# Patient Record
Sex: Male | Born: 1945
Health system: Southern US, Community
[De-identification: ages and names within clinical notes are randomized; demographics above are authoritative.]

## PROBLEM LIST (undated history)

## (undated) ENCOUNTER — Emergency Department (HOSPITAL_BASED_OUTPATIENT_CLINIC_OR_DEPARTMENT_OTHER): Admission: EM | Payer: Self-pay | Source: Home / Self Care

## (undated) DIAGNOSIS — I340 Nonrheumatic mitral (valve) insufficiency: Secondary | ICD-10-CM

## (undated) DIAGNOSIS — K5792 Diverticulitis of intestine, part unspecified, without perforation or abscess without bleeding: Secondary | ICD-10-CM

## (undated) DIAGNOSIS — M1990 Unspecified osteoarthritis, unspecified site: Secondary | ICD-10-CM

## (undated) DIAGNOSIS — E119 Type 2 diabetes mellitus without complications: Secondary | ICD-10-CM

## (undated) DIAGNOSIS — M797 Fibromyalgia: Secondary | ICD-10-CM

## (undated) DIAGNOSIS — I10 Essential (primary) hypertension: Secondary | ICD-10-CM

## (undated) DIAGNOSIS — J449 Chronic obstructive pulmonary disease, unspecified: Secondary | ICD-10-CM

## (undated) DIAGNOSIS — H9190 Unspecified hearing loss, unspecified ear: Secondary | ICD-10-CM

## (undated) DIAGNOSIS — J189 Pneumonia, unspecified organism: Secondary | ICD-10-CM

## (undated) DIAGNOSIS — M069 Rheumatoid arthritis, unspecified: Secondary | ICD-10-CM

## (undated) HISTORY — DX: Rheumatoid arthritis, unspecified: M06.9

## (undated) HISTORY — DX: Chronic obstructive pulmonary disease, unspecified: J44.9

## (undated) HISTORY — PX: HERNIA REPAIR: SHX51

## (undated) HISTORY — PX: CORONARY ANGIOPLASTY WITH STENT PLACEMENT: SHX49

---

## 1984-05-29 HISTORY — PX: HAND SURGERY: SHX662

## 2011-05-30 DIAGNOSIS — I2102 ST elevation (STEMI) myocardial infarction involving left anterior descending coronary artery: Secondary | ICD-10-CM

## 2011-05-30 HISTORY — PX: CORONARY ANGIOPLASTY WITH STENT PLACEMENT: SHX49

## 2011-05-30 HISTORY — DX: ST elevation (STEMI) myocardial infarction involving left anterior descending coronary artery: I21.02

## 2014-06-15 DIAGNOSIS — K219 Gastro-esophageal reflux disease without esophagitis: Secondary | ICD-10-CM | POA: Diagnosis not present

## 2014-06-15 DIAGNOSIS — I10 Essential (primary) hypertension: Secondary | ICD-10-CM | POA: Diagnosis not present

## 2014-06-15 DIAGNOSIS — I259 Chronic ischemic heart disease, unspecified: Secondary | ICD-10-CM | POA: Diagnosis not present

## 2014-06-15 DIAGNOSIS — E785 Hyperlipidemia, unspecified: Secondary | ICD-10-CM | POA: Diagnosis not present

## 2014-06-15 DIAGNOSIS — I2510 Atherosclerotic heart disease of native coronary artery without angina pectoris: Secondary | ICD-10-CM | POA: Diagnosis not present

## 2014-06-15 DIAGNOSIS — K5720 Diverticulitis of large intestine with perforation and abscess without bleeding: Secondary | ICD-10-CM | POA: Diagnosis not present

## 2014-10-12 DIAGNOSIS — M069 Rheumatoid arthritis, unspecified: Secondary | ICD-10-CM | POA: Diagnosis not present

## 2014-10-12 DIAGNOSIS — K219 Gastro-esophageal reflux disease without esophagitis: Secondary | ICD-10-CM | POA: Diagnosis not present

## 2014-10-12 DIAGNOSIS — K5720 Diverticulitis of large intestine with perforation and abscess without bleeding: Secondary | ICD-10-CM | POA: Diagnosis not present

## 2014-10-12 DIAGNOSIS — F419 Anxiety disorder, unspecified: Secondary | ICD-10-CM | POA: Diagnosis not present

## 2014-10-12 DIAGNOSIS — I10 Essential (primary) hypertension: Secondary | ICD-10-CM | POA: Diagnosis not present

## 2015-02-15 DIAGNOSIS — I259 Chronic ischemic heart disease, unspecified: Secondary | ICD-10-CM | POA: Diagnosis not present

## 2015-02-15 DIAGNOSIS — K219 Gastro-esophageal reflux disease without esophagitis: Secondary | ICD-10-CM | POA: Diagnosis not present

## 2015-02-15 DIAGNOSIS — I10 Essential (primary) hypertension: Secondary | ICD-10-CM | POA: Diagnosis not present

## 2015-02-15 DIAGNOSIS — E785 Hyperlipidemia, unspecified: Secondary | ICD-10-CM | POA: Diagnosis not present

## 2015-07-13 DIAGNOSIS — I2510 Atherosclerotic heart disease of native coronary artery without angina pectoris: Secondary | ICD-10-CM | POA: Diagnosis not present

## 2015-07-13 DIAGNOSIS — I10 Essential (primary) hypertension: Secondary | ICD-10-CM | POA: Diagnosis not present

## 2015-07-13 DIAGNOSIS — K219 Gastro-esophageal reflux disease without esophagitis: Secondary | ICD-10-CM | POA: Diagnosis not present

## 2015-07-13 DIAGNOSIS — F419 Anxiety disorder, unspecified: Secondary | ICD-10-CM | POA: Diagnosis not present

## 2015-07-13 DIAGNOSIS — Z0001 Encounter for general adult medical examination with abnormal findings: Secondary | ICD-10-CM | POA: Diagnosis not present

## 2015-09-29 DIAGNOSIS — I10 Essential (primary) hypertension: Secondary | ICD-10-CM | POA: Diagnosis not present

## 2015-09-29 DIAGNOSIS — R05 Cough: Secondary | ICD-10-CM | POA: Diagnosis not present

## 2015-11-22 DIAGNOSIS — M06879 Other specified rheumatoid arthritis, unspecified ankle and foot: Secondary | ICD-10-CM | POA: Diagnosis not present

## 2015-11-22 DIAGNOSIS — K219 Gastro-esophageal reflux disease without esophagitis: Secondary | ICD-10-CM | POA: Diagnosis not present

## 2015-11-22 DIAGNOSIS — R05 Cough: Secondary | ICD-10-CM | POA: Diagnosis not present

## 2015-11-22 DIAGNOSIS — I2510 Atherosclerotic heart disease of native coronary artery without angina pectoris: Secondary | ICD-10-CM | POA: Diagnosis not present

## 2015-12-07 DIAGNOSIS — M069 Rheumatoid arthritis, unspecified: Secondary | ICD-10-CM | POA: Diagnosis not present

## 2015-12-07 DIAGNOSIS — J449 Chronic obstructive pulmonary disease, unspecified: Secondary | ICD-10-CM | POA: Diagnosis not present

## 2015-12-07 DIAGNOSIS — J42 Unspecified chronic bronchitis: Secondary | ICD-10-CM | POA: Diagnosis not present

## 2015-12-14 DIAGNOSIS — J42 Unspecified chronic bronchitis: Secondary | ICD-10-CM | POA: Diagnosis not present

## 2015-12-14 DIAGNOSIS — R911 Solitary pulmonary nodule: Secondary | ICD-10-CM | POA: Diagnosis not present

## 2015-12-14 DIAGNOSIS — F17201 Nicotine dependence, unspecified, in remission: Secondary | ICD-10-CM | POA: Diagnosis not present

## 2015-12-22 DIAGNOSIS — R911 Solitary pulmonary nodule: Secondary | ICD-10-CM | POA: Diagnosis not present

## 2015-12-22 DIAGNOSIS — R05 Cough: Secondary | ICD-10-CM | POA: Diagnosis not present

## 2015-12-22 DIAGNOSIS — J42 Unspecified chronic bronchitis: Secondary | ICD-10-CM | POA: Diagnosis not present

## 2015-12-27 DIAGNOSIS — R911 Solitary pulmonary nodule: Secondary | ICD-10-CM | POA: Diagnosis not present

## 2015-12-27 DIAGNOSIS — F17201 Nicotine dependence, unspecified, in remission: Secondary | ICD-10-CM | POA: Diagnosis not present

## 2015-12-28 DIAGNOSIS — M069 Rheumatoid arthritis, unspecified: Secondary | ICD-10-CM | POA: Diagnosis not present

## 2015-12-28 DIAGNOSIS — R911 Solitary pulmonary nodule: Secondary | ICD-10-CM | POA: Diagnosis not present

## 2015-12-28 DIAGNOSIS — F17201 Nicotine dependence, unspecified, in remission: Secondary | ICD-10-CM | POA: Diagnosis not present

## 2016-01-20 DIAGNOSIS — R42 Dizziness and giddiness: Secondary | ICD-10-CM | POA: Diagnosis not present

## 2016-01-21 DIAGNOSIS — H8113 Benign paroxysmal vertigo, bilateral: Secondary | ICD-10-CM | POA: Diagnosis not present

## 2016-01-21 DIAGNOSIS — R51 Headache: Secondary | ICD-10-CM | POA: Diagnosis not present

## 2016-01-21 DIAGNOSIS — H8110 Benign paroxysmal vertigo, unspecified ear: Secondary | ICD-10-CM | POA: Diagnosis not present

## 2016-01-21 DIAGNOSIS — I10 Essential (primary) hypertension: Secondary | ICD-10-CM | POA: Diagnosis not present

## 2016-01-21 DIAGNOSIS — R42 Dizziness and giddiness: Secondary | ICD-10-CM | POA: Diagnosis not present

## 2016-05-08 DIAGNOSIS — E119 Type 2 diabetes mellitus without complications: Secondary | ICD-10-CM | POA: Diagnosis not present

## 2016-05-08 DIAGNOSIS — M069 Rheumatoid arthritis, unspecified: Secondary | ICD-10-CM | POA: Diagnosis not present

## 2016-05-08 DIAGNOSIS — I2510 Atherosclerotic heart disease of native coronary artery without angina pectoris: Secondary | ICD-10-CM | POA: Diagnosis not present

## 2016-05-08 DIAGNOSIS — E785 Hyperlipidemia, unspecified: Secondary | ICD-10-CM | POA: Diagnosis not present

## 2016-05-08 DIAGNOSIS — I10 Essential (primary) hypertension: Secondary | ICD-10-CM | POA: Diagnosis not present

## 2016-05-08 DIAGNOSIS — K219 Gastro-esophageal reflux disease without esophagitis: Secondary | ICD-10-CM | POA: Diagnosis not present

## 2016-05-08 DIAGNOSIS — Z1389 Encounter for screening for other disorder: Secondary | ICD-10-CM | POA: Diagnosis not present

## 2016-08-21 DIAGNOSIS — F419 Anxiety disorder, unspecified: Secondary | ICD-10-CM | POA: Diagnosis not present

## 2016-08-21 DIAGNOSIS — E669 Obesity, unspecified: Secondary | ICD-10-CM | POA: Diagnosis not present

## 2016-08-21 DIAGNOSIS — Z9181 History of falling: Secondary | ICD-10-CM | POA: Diagnosis not present

## 2016-08-21 DIAGNOSIS — Z6831 Body mass index (BMI) 31.0-31.9, adult: Secondary | ICD-10-CM | POA: Diagnosis not present

## 2016-08-21 DIAGNOSIS — Z1389 Encounter for screening for other disorder: Secondary | ICD-10-CM | POA: Diagnosis not present

## 2016-08-21 DIAGNOSIS — I10 Essential (primary) hypertension: Secondary | ICD-10-CM | POA: Diagnosis not present

## 2016-08-21 DIAGNOSIS — K219 Gastro-esophageal reflux disease without esophagitis: Secondary | ICD-10-CM | POA: Diagnosis not present

## 2016-09-22 DIAGNOSIS — Z79899 Other long term (current) drug therapy: Secondary | ICD-10-CM | POA: Diagnosis not present

## 2016-09-22 DIAGNOSIS — I2510 Atherosclerotic heart disease of native coronary artery without angina pectoris: Secondary | ICD-10-CM | POA: Diagnosis not present

## 2016-09-22 DIAGNOSIS — R739 Hyperglycemia, unspecified: Secondary | ICD-10-CM | POA: Diagnosis not present

## 2016-09-22 DIAGNOSIS — Z6831 Body mass index (BMI) 31.0-31.9, adult: Secondary | ICD-10-CM | POA: Diagnosis not present

## 2016-09-22 DIAGNOSIS — E785 Hyperlipidemia, unspecified: Secondary | ICD-10-CM | POA: Diagnosis not present

## 2016-09-22 DIAGNOSIS — E876 Hypokalemia: Secondary | ICD-10-CM | POA: Diagnosis not present

## 2016-09-22 DIAGNOSIS — F419 Anxiety disorder, unspecified: Secondary | ICD-10-CM | POA: Diagnosis not present

## 2016-09-22 DIAGNOSIS — I10 Essential (primary) hypertension: Secondary | ICD-10-CM | POA: Diagnosis not present

## 2016-10-25 DIAGNOSIS — I10 Essential (primary) hypertension: Secondary | ICD-10-CM | POA: Diagnosis not present

## 2016-10-25 DIAGNOSIS — F419 Anxiety disorder, unspecified: Secondary | ICD-10-CM | POA: Diagnosis not present

## 2016-10-25 DIAGNOSIS — Z6831 Body mass index (BMI) 31.0-31.9, adult: Secondary | ICD-10-CM | POA: Diagnosis not present

## 2016-10-25 DIAGNOSIS — E1165 Type 2 diabetes mellitus with hyperglycemia: Secondary | ICD-10-CM | POA: Diagnosis not present

## 2016-10-25 DIAGNOSIS — E785 Hyperlipidemia, unspecified: Secondary | ICD-10-CM | POA: Diagnosis not present

## 2016-12-04 DIAGNOSIS — Z6830 Body mass index (BMI) 30.0-30.9, adult: Secondary | ICD-10-CM | POA: Diagnosis not present

## 2016-12-04 DIAGNOSIS — E1165 Type 2 diabetes mellitus with hyperglycemia: Secondary | ICD-10-CM | POA: Diagnosis not present

## 2016-12-04 DIAGNOSIS — M069 Rheumatoid arthritis, unspecified: Secondary | ICD-10-CM | POA: Diagnosis not present

## 2016-12-04 DIAGNOSIS — F419 Anxiety disorder, unspecified: Secondary | ICD-10-CM | POA: Diagnosis not present

## 2017-02-15 DIAGNOSIS — E1165 Type 2 diabetes mellitus with hyperglycemia: Secondary | ICD-10-CM | POA: Diagnosis not present

## 2017-02-20 DIAGNOSIS — E785 Hyperlipidemia, unspecified: Secondary | ICD-10-CM | POA: Diagnosis not present

## 2017-02-20 DIAGNOSIS — I10 Essential (primary) hypertension: Secondary | ICD-10-CM | POA: Diagnosis not present

## 2017-02-20 DIAGNOSIS — E663 Overweight: Secondary | ICD-10-CM | POA: Diagnosis not present

## 2017-02-20 DIAGNOSIS — Z6829 Body mass index (BMI) 29.0-29.9, adult: Secondary | ICD-10-CM | POA: Diagnosis not present

## 2017-02-20 DIAGNOSIS — R0981 Nasal congestion: Secondary | ICD-10-CM | POA: Diagnosis not present

## 2017-02-20 DIAGNOSIS — E1165 Type 2 diabetes mellitus with hyperglycemia: Secondary | ICD-10-CM | POA: Diagnosis not present

## 2017-02-20 DIAGNOSIS — Z79899 Other long term (current) drug therapy: Secondary | ICD-10-CM | POA: Diagnosis not present

## 2017-04-26 DIAGNOSIS — Z6829 Body mass index (BMI) 29.0-29.9, adult: Secondary | ICD-10-CM | POA: Diagnosis not present

## 2017-04-26 DIAGNOSIS — K219 Gastro-esophageal reflux disease without esophagitis: Secondary | ICD-10-CM | POA: Diagnosis not present

## 2017-04-26 DIAGNOSIS — L989 Disorder of the skin and subcutaneous tissue, unspecified: Secondary | ICD-10-CM | POA: Diagnosis not present

## 2017-04-26 DIAGNOSIS — E1165 Type 2 diabetes mellitus with hyperglycemia: Secondary | ICD-10-CM | POA: Diagnosis not present

## 2017-04-26 DIAGNOSIS — I10 Essential (primary) hypertension: Secondary | ICD-10-CM | POA: Diagnosis not present

## 2017-04-26 DIAGNOSIS — F419 Anxiety disorder, unspecified: Secondary | ICD-10-CM | POA: Diagnosis not present

## 2017-06-22 DIAGNOSIS — K219 Gastro-esophageal reflux disease without esophagitis: Secondary | ICD-10-CM | POA: Diagnosis not present

## 2017-06-22 DIAGNOSIS — Z6829 Body mass index (BMI) 29.0-29.9, adult: Secondary | ICD-10-CM | POA: Diagnosis not present

## 2017-06-22 DIAGNOSIS — K5792 Diverticulitis of intestine, part unspecified, without perforation or abscess without bleeding: Secondary | ICD-10-CM | POA: Diagnosis not present

## 2017-06-22 DIAGNOSIS — Z79899 Other long term (current) drug therapy: Secondary | ICD-10-CM | POA: Diagnosis not present

## 2017-06-22 DIAGNOSIS — M069 Rheumatoid arthritis, unspecified: Secondary | ICD-10-CM | POA: Diagnosis not present

## 2017-06-22 DIAGNOSIS — E785 Hyperlipidemia, unspecified: Secondary | ICD-10-CM | POA: Diagnosis not present

## 2017-06-22 DIAGNOSIS — F419 Anxiety disorder, unspecified: Secondary | ICD-10-CM | POA: Diagnosis not present

## 2017-06-22 DIAGNOSIS — E1165 Type 2 diabetes mellitus with hyperglycemia: Secondary | ICD-10-CM | POA: Diagnosis not present

## 2017-07-01 ENCOUNTER — Encounter (HOSPITAL_BASED_OUTPATIENT_CLINIC_OR_DEPARTMENT_OTHER): Payer: Self-pay | Admitting: Emergency Medicine

## 2017-07-01 ENCOUNTER — Emergency Department (HOSPITAL_BASED_OUTPATIENT_CLINIC_OR_DEPARTMENT_OTHER)
Admission: EM | Admit: 2017-07-01 | Discharge: 2017-07-01 | Disposition: A | Discharge status: Home / Self Care | Payer: Medicare Other | Attending: Emergency Medicine | Admitting: Emergency Medicine

## 2017-07-01 ENCOUNTER — Other Ambulatory Visit: Payer: Self-pay

## 2017-07-01 DIAGNOSIS — H66002 Acute suppurative otitis media without spontaneous rupture of ear drum, left ear: Secondary | ICD-10-CM | POA: Insufficient documentation

## 2017-07-01 DIAGNOSIS — Z79899 Other long term (current) drug therapy: Secondary | ICD-10-CM | POA: Diagnosis not present

## 2017-07-01 DIAGNOSIS — R070 Pain in throat: Secondary | ICD-10-CM | POA: Diagnosis not present

## 2017-07-01 DIAGNOSIS — E119 Type 2 diabetes mellitus without complications: Secondary | ICD-10-CM | POA: Diagnosis not present

## 2017-07-01 DIAGNOSIS — I10 Essential (primary) hypertension: Secondary | ICD-10-CM | POA: Diagnosis not present

## 2017-07-01 DIAGNOSIS — Z7984 Long term (current) use of oral hypoglycemic drugs: Secondary | ICD-10-CM | POA: Diagnosis not present

## 2017-07-01 DIAGNOSIS — Z7982 Long term (current) use of aspirin: Secondary | ICD-10-CM | POA: Insufficient documentation

## 2017-07-01 DIAGNOSIS — H9202 Otalgia, left ear: Secondary | ICD-10-CM | POA: Diagnosis not present

## 2017-07-01 DIAGNOSIS — R05 Cough: Secondary | ICD-10-CM | POA: Diagnosis present

## 2017-07-01 DIAGNOSIS — I491 Atrial premature depolarization: Secondary | ICD-10-CM | POA: Diagnosis not present

## 2017-07-01 DIAGNOSIS — Z7902 Long term (current) use of antithrombotics/antiplatelets: Secondary | ICD-10-CM | POA: Insufficient documentation

## 2017-07-01 DIAGNOSIS — R0981 Nasal congestion: Secondary | ICD-10-CM | POA: Diagnosis not present

## 2017-07-01 DIAGNOSIS — R590 Localized enlarged lymph nodes: Secondary | ICD-10-CM | POA: Diagnosis not present

## 2017-07-01 DIAGNOSIS — J069 Acute upper respiratory infection, unspecified: Secondary | ICD-10-CM | POA: Diagnosis not present

## 2017-07-01 DIAGNOSIS — M542 Cervicalgia: Secondary | ICD-10-CM | POA: Insufficient documentation

## 2017-07-01 HISTORY — DX: Unspecified osteoarthritis, unspecified site: M19.90

## 2017-07-01 HISTORY — DX: Diverticulitis of intestine, part unspecified, without perforation or abscess without bleeding: K57.92

## 2017-07-01 HISTORY — DX: Essential (primary) hypertension: I10

## 2017-07-01 HISTORY — DX: Type 2 diabetes mellitus without complications: E11.9

## 2017-07-01 MED ORDER — AMOXICILLIN-POT CLAVULANATE 875-125 MG PO TABS: 1.0000 | ORAL_TABLET | Freq: Two times a day (BID) | ORAL | 0 refills | Status: AC

## 2017-07-01 MED ORDER — GI COCKTAIL ~~LOC~~
30.0000 mL | Freq: Once | ORAL | Status: AC
  Administered 2017-07-01: 30 mL via ORAL
  Filled 2017-07-01: qty 30

## 2017-07-01 NOTE — ED Provider Notes (Signed)
[facility] EMERGENCY DEPARTMENT Provider Note   CSN: [id number] Arrival date & time: 07/01/17  1028     History   Chief Complaint Chief Complaint  Patient presents with  . Neck Pain    HPI Cristen Murcia is a 72 y.o. male.  HPI   72 year old male with a history of diabetes, hypertension presents with concern for cough, congestion, left ear pain, and lymph node swelling.  Reports the cough and congestion began around 5 days ago, and was associate with sore throat.  Yesterday, he began to have left ear pain which is severe, with noted submandibular swelling.  Denies any known fevers.  Does report he is having some left-sided sinus fullness and pressure.  Patient reports he does have a history of stent placement.  He began to have heartburn in the emergency department which is typical of his heartburn, not typical of the pain he had with coronary artery disease, is requesting any comes from his car.  Past Medical History:  Diagnosis Date  . Arthritis   . Diabetes mellitus without complication (HCC)   . Diverticulitis   . Hypertension     There are no active problems to display for this patient.   History reviewed. No pertinent surgical history.     Home Medications    Prior to Admission medications   Medication Sig Start Date End Date Taking? Authorizing Provider  aspirin EC 81 MG tablet Take 81 mg by mouth daily.   Yes [provider]  diazepam (VALIUM) 5 MG tablet Take 5 mg by mouth every 6 (six) hours as needed for anxiety.   Yes [provider]  Dulaglutide (TRULICITY) 1.5 MG/0.5ML SOPN Inject into the skin.   Yes [provider]  irbesartan-hydrochlorothiazide (AVALIDE) 150-12.5 MG tablet Take 1 tablet by mouth daily.   Yes [provider]  potassium chloride (K-DUR,KLOR-CON) 10 MEQ tablet Take 10 mEq by mouth 2 (two) times daily.   Yes [provider]  pravastatin (PRAVACHOL) 20 MG tablet Take 20 mg by mouth  daily.   Yes [provider]  amoxicillin-clavulanate (AUGMENTIN) 875-125 MG tablet Take 1 tablet by mouth every 12 (twelve) hours for 10 days. 07/01/17 07/11/17  Alvira Monday, MD    Family History History reviewed. No pertinent family history.  Social History Social History   Tobacco Use  . Smoking status: Never Smoker  . Smokeless tobacco: Never Used  Substance Use Topics  . Alcohol use: No    Frequency: Never  . Drug use: No     Allergies   Ivp dye [iodinated diagnostic agents]   Review of Systems Review of Systems  Constitutional: Negative for fever.  HENT: Positive for congestion and sore throat.   Eyes: Negative for visual disturbance.  Respiratory: Positive for cough. Negative for shortness of breath.   Cardiovascular: Negative for chest pain.  Gastrointestinal: Negative for abdominal pain, nausea and vomiting.  Genitourinary: Negative for difficulty urinating.  Musculoskeletal: Negative for back pain and neck stiffness.  Skin: Negative for rash.  Neurological: Negative for syncope and headaches.     Physical Exam Updated Vital Signs BP (!) 155/93   Pulse 67   Temp 98.5 F (36.9 C) (Oral)   Resp 15   Ht 5\' 10"  (1.778 m)   Wt 83.9 kg (185 lb)   SpO2 99%   BMI 26.54 kg/m   Physical Exam  Constitutional: He is oriented to person, place, and time. He appears well-developed and well-nourished. No distress.  HENT:  Head: Normocephalic and atraumatic.  Submandibular lymphadenopathy, preauricular lymph nodes, No surrounding erythema  Eyes: Conjunctivae and EOM are normal. Pupils are equal, round, and reactive to light.  Left TM bulging, purulent, fluid, right TM with effusion   Neck: Normal range of motion.  Cardiovascular: Normal rate, regular rhythm, normal heart sounds and intact distal pulses. Exam reveals no gallop and no friction rub.  No murmur heard. Pulmonary/Chest: Effort normal and breath sounds normal. No respiratory distress. He  has no wheezes. He has no rales.  Abdominal: Soft. He exhibits no distension. There is no tenderness. There is no guarding.  Musculoskeletal: He exhibits no edema.  Lymphadenopathy:    He has cervical adenopathy (bilateral).  Neurological: He is alert and oriented to person, place, and time.  Skin: Skin is warm and dry. He is not diaphoretic.  Nursing note and vitals reviewed.    ED Treatments / Results  Labs (all labs ordered are listed, but only abnormal results are displayed) Labs Reviewed - No data to display  EKG  EKG Interpretation  Date/Time:  Sunday July 01 2017 13:23:41 EST Ventricular Rate:  65 PR Interval:    QRS Duration: 122 QT Interval:  352 QTC Calculation: 366 R Axis:   -56 Text Interpretation:  Sinus rhythm Atrial premature complex IVCD, consider atypical RBBB LVH with secondary repolarization abnormality Anterior infarct, old Baseline wander in lead(s) II V3 No previous ECGs available Confirmed by Erynne Kealey ([id number]) on 07/01/2017 1:44:00 PM       Radiology No results found.  Procedures Procedures (including critical care time)  Medications Ordered in ED Medications  gi cocktail (Maalox,Lidocaine,Donnatal) (30 mLs Oral Given 07/01/17 1340)     Initial Impression / Assessment and Plan / ED Course  I have reviewed the triage vital signs and the nursing notes.  Pertinent labs & imaging results that were available during my care of the patient were reviewed by me and considered in my medical decision making (see chart for details).     72 year old male with a history of diabetes, hypertension presents with concern for cough, congestion, left ear pain, and lymph node swelling.  History is consistent with a likely viral URI, with viral pharyngitis, and reactive lymphadenopathy secondary to infection.  Patient does have signs of a left-sided otitis media and given duration of symptoms prior to development of ear pain and symptoms, suspect likely  bacterial otitis media.  He did have a small amount of wax which was irrigated from by nursing in the emergency department.  He has some pre-auricular swelling and submandibular swelling which I feels most likely lymphadenopathy secondary to infection.  He does not have significant parotid swelling, erythema over the parotid and have low suspicion for a supperative parotitis or abscess.  Given prescription for Augmentin for likely bacterial otitis media.  Recommend follow-up with his primary care physician in 2 weeks for reevaluation.  Discussed that his lymphadenopathy is likely reactive secondary to viral infection, however if he does not have symptoms he may require further evaluation.   Final Clinical Impressions(s) / ED Diagnoses   Final diagnoses:  Upper respiratory tract infection, unspecified type  Cervical lymphadenopathy  Acute suppurative otitis media of left ear without spontaneous rupture of tympanic membrane, recurrence not specified    ED Discharge Orders        Ordered    amoxicillin-clavulanate (AUGMENTIN) 875-125 MG tablet  Every 12 hours     02 /03/19 1342       04-01-2005, MD 07/01/17  1840  

## 2017-07-01 NOTE — ED Notes (Signed)
L ear irrigated with noted wax evacuated from ear. Pt reports improvement in ear discomfort.

## 2017-07-01 NOTE — ED Triage Notes (Signed)
Patient states that he started having swelling to his bilateral neck and Lymph nodes yesterday

## 2017-08-20 DIAGNOSIS — E663 Overweight: Secondary | ICD-10-CM | POA: Diagnosis not present

## 2017-08-20 DIAGNOSIS — F419 Anxiety disorder, unspecified: Secondary | ICD-10-CM | POA: Diagnosis not present

## 2017-08-20 DIAGNOSIS — Z6829 Body mass index (BMI) 29.0-29.9, adult: Secondary | ICD-10-CM | POA: Diagnosis not present

## 2017-08-20 DIAGNOSIS — M069 Rheumatoid arthritis, unspecified: Secondary | ICD-10-CM | POA: Diagnosis not present

## 2017-08-20 DIAGNOSIS — J449 Chronic obstructive pulmonary disease, unspecified: Secondary | ICD-10-CM | POA: Diagnosis not present

## 2017-09-12 DIAGNOSIS — E785 Hyperlipidemia, unspecified: Secondary | ICD-10-CM | POA: Diagnosis not present

## 2017-09-12 DIAGNOSIS — Z139 Encounter for screening, unspecified: Secondary | ICD-10-CM | POA: Diagnosis not present

## 2017-09-12 DIAGNOSIS — Z125 Encounter for screening for malignant neoplasm of prostate: Secondary | ICD-10-CM | POA: Diagnosis not present

## 2017-09-12 DIAGNOSIS — Z9181 History of falling: Secondary | ICD-10-CM | POA: Diagnosis not present

## 2017-09-12 DIAGNOSIS — Z0000 Encounter for general adult medical examination without abnormal findings: Secondary | ICD-10-CM | POA: Diagnosis not present

## 2017-10-23 DIAGNOSIS — F419 Anxiety disorder, unspecified: Secondary | ICD-10-CM | POA: Diagnosis not present

## 2017-10-23 DIAGNOSIS — E785 Hyperlipidemia, unspecified: Secondary | ICD-10-CM | POA: Diagnosis not present

## 2017-10-23 DIAGNOSIS — Z79899 Other long term (current) drug therapy: Secondary | ICD-10-CM | POA: Diagnosis not present

## 2017-10-23 DIAGNOSIS — M542 Cervicalgia: Secondary | ICD-10-CM | POA: Diagnosis not present

## 2017-10-23 DIAGNOSIS — E1165 Type 2 diabetes mellitus with hyperglycemia: Secondary | ICD-10-CM | POA: Diagnosis not present

## 2017-12-25 DIAGNOSIS — M069 Rheumatoid arthritis, unspecified: Secondary | ICD-10-CM | POA: Diagnosis not present

## 2017-12-25 DIAGNOSIS — E1165 Type 2 diabetes mellitus with hyperglycemia: Secondary | ICD-10-CM | POA: Diagnosis not present

## 2017-12-25 DIAGNOSIS — F419 Anxiety disorder, unspecified: Secondary | ICD-10-CM | POA: Diagnosis not present

## 2017-12-25 DIAGNOSIS — G4700 Insomnia, unspecified: Secondary | ICD-10-CM | POA: Diagnosis not present

## 2018-01-03 NOTE — Progress Notes (Signed)
Office Visit Note  Patient: Patrick Klein             Date of Birth: 1945/11/09           MRN: [id number]             PCP: [facility] Referring: Janine Limbo, PA-C Visit Date: 01/17/2018 Occupation: Cleaning offices  Subjective: Pain and swelling in multiple joints.   History of Present Illness: Patrick Klein is a 72 y.o. male seen in consultation per request of his PCP.  He is accompanied by his wife today.  Who helped giving his history.  The patient states that age 55 he fell and after that he started having right knee joint swelling.  After that the swelling is spread all over his joints.  He was seen by a physician and then a rheumatologist.  The rheumatologist advised DMARDs but he declined.  He decided to take all medications over-the-counter and home remedies.  He has been taking prednisone on PRN basis since then.  He states he takes prednisone 5 mg for 3 days and then usually stops it.  He has not seen a rheumatologist since his initial visit.  Recently has been experiencing increased pain and discomfort.  He describes pain and swelling in his bilateral knee joints and bilateral hands.  He continues to have neck and lower back pain.  He states he was diagnosed with disc disease of lumbar spine in the past.  Activities of Daily Living:  Patient reports morning stiffness for 1 hour.   Patient Reports nocturnal pain.  Difficulty dressing/grooming: Denies Difficulty climbing stairs: Reports Difficulty getting out of chair: Reports Difficulty using hands for taps, buttons, cutlery, and/or writing: Denies  Review of Systems  Constitutional: Negative for fatigue and night sweats.  HENT: Negative for mouth sores, mouth dryness and nose dryness.   Eyes: Negative for redness and dryness.  Respiratory: Negative for shortness of breath and difficulty breathing.   Cardiovascular: Negative for chest pain, palpitations, hypertension, irregular heartbeat and swelling in  legs/feet.  Gastrointestinal: Positive for constipation and diarrhea.       History of diverticulitis  Endocrine: Negative for increased urination.  Musculoskeletal: Positive for arthralgias, joint pain, joint swelling and morning stiffness. Negative for myalgias, muscle weakness, muscle tenderness and myalgias.  Skin: Negative for color change, rash, hair loss, nodules/bumps, skin tightness, ulcers and sensitivity to sunlight.  Allergic/Immunologic: Negative for susceptible to infections.  Neurological: Negative for dizziness, fainting, memory loss, night sweats and weakness ( ).  Hematological: Negative for swollen glands.  Psychiatric/Behavioral: Positive for depressed mood and sleep disturbance. The patient is nervous/anxious.     PMFS History:  There are no active problems to display for this patient.   Past Medical History:  Diagnosis Date  . Arthritis   . Diabetes mellitus without complication ([facility])   . Diverticulitis   . Hypertension     Family History  Problem Relation Age of Onset  . Stroke Mother   . Mitral valve prolapse Mother   . Heart attack Father   . Alcoholism Father   . Alcoholism Brother   . Congestive Heart Failure Brother   . Alcoholism Brother   . Congestive Heart Failure Brother   . Anxiety disorder Daughter   . Healthy Daughter    Past Surgical History:  Procedure Laterality Date  . CORONARY ANGIOPLASTY WITH STENT PLACEMENT    . HAND SURGERY Right 1986   reconstructive surgery on fingers   . HERNIA REPAIR  x2   Social History   Social History Narrative  . Not on file    Objective: Vital Signs: BP (!) 153/81 (BP Location: Right Arm, Patient Position: Sitting, Cuff Size: Normal)   Pulse 65   Resp 15   Ht 5' 6.14" (1.68 m)   Wt 196 lb (88.9 kg)   BMI 31.50 kg/m    Physical Exam  Constitutional: He is oriented to person, place, and time. He appears well-developed and well-nourished.  HENT:  Head: Normocephalic and atraumatic.    Eyes: Pupils are equal, round, and reactive to light. Conjunctivae and EOM are normal.  Neck: Normal range of motion. Neck supple.  Cardiovascular: Normal rate, regular rhythm and normal heart sounds.  Pulmonary/Chest: Effort normal and breath sounds normal.  Abdominal: Soft. Bowel sounds are normal.  Neurological: He is alert and oriented to person, place, and time.  Skin: Skin is warm and dry. Capillary refill takes less than 2 seconds.  Psychiatric: He has a normal mood and affect. His behavior is normal.  Nursing note and vitals reviewed.    Musculoskeletal Exam: C-spine some stiffness with range of motion.  Lumbar spine limited range of motion.  Shoulder joints and elbow joints were in good range of motion.  He has good range of motion of bilateral wrist joints.  Thickening of PIP and DIP joints was noted.  No synovitis was noted.  Hip joints were in good range of motion without discomfort.  He has warmth and swelling in his right knee joint.  He has limited extension of his right knee joint.  Left knee joint was in good range of motion.  Ankle joints MTPs PIPs were in good range of motion with no synovitis.  CDAI Exam: CDAI Score: 2  Patient Global Assessment: 5 (mm); Provider Global Assessment: 5 (mm) Swollen: 1 ; Tender: 0  Joint Exam      Right  Left  Knee  Swollen         Investigation: No additional findings.  Imaging: Xr Foot 2 Views Left  Result Date: 01/17/2018 First MTP, all PIPs and all DIP narrowing was noted.  Spurring was noted at the first MTP joint.  No erosive changes were noted.  No intertarsal or tibiotalar joint space narrowing was noted.  A small calcaneal spur was noted. Impression: These findings are consistent with osteoarthritis of the foot.  Xr Foot 2 Views Right  Result Date: 01/17/2018 First MTP, all PIPs and all DIP narrowing was noted.  Spurring was noted at the first MTP joint.  No erosive changes were noted.  No intertarsal or tibiotalar joint  space narrowing was noted.  A small calcaneal spur was noted. Impression: These findings are consistent with osteoarthritis of the foot.  Xr Hand 2 View Left  Result Date: 01/17/2018 PIP and DIP narrowing was noted.  No MCP changes were noted.  CMC narrowing was noted.  No intercarpal radiocarpal joint space narrowing was noted.  No erosive changes were noted. Impression: These findings are consistent with osteoarthritis of the hand.  Xr Hand 2 View Right  Result Date: 01/17/2018 No MCP narrowing was noted.  PIP and DIP narrowing was noted.  A pin was present in the right fourth middle phalanx.  No intercarpal radiocarpal joint space narrowing was noted.  No erosive changes were noted. Impression: These findings are consistent with osteoarthritis of the hand.  Xr Knee 3 View Left  Result Date: 01/17/2018 Severe medial compartment narrowing was noted.  No chondrocalcinosis was  noted.  Severe patellofemoral narrowing was noted. Impression : These findings are consistent with severe osteoarthritis and severe chondromalacia patella.  Xr Knee 3 View Right  Result Date: 01/17/2018 Severe medial compartment narrowing was noted.  No chondrocalcinosis was noted.  Severe patellofemoral narrowing was noted. Impression : These findings are consistent with severe osteoarthritis and severe chondromalacia patella.   Recent Labs: No results found for: WBC, HGB, PLT, NA, K, CL, CO2, GLUCOSE, BUN, CREATININE, BILITOT, ALKPHOS, AST, ALT, PROT, ALBUMIN, CALCIUM, GFRAA, QFTBGOLD, QFTBGOLDPLUS  Speciality Comments: No specialty comments available.  Procedures:  No procedures performed Allergies: Ivp dye [iodinated diagnostic agents]   Assessment / Plan:     Visit Diagnoses: History of inflammatory arthritis- last seen by rheumatologist 15 yr ago.  Patient reports that he was diagnosed with rheumatoid arthritis by rheumatologist several years ago.  He is never taken any DMARDs.  He takes prednisone 5 mg for 3  days every few months.  He had no synovitis on examination today except for swelling in his right knee joint.  I will obtain following labs to evaluate further.  Long-term use of systemic steroids-patient takes prednisone intermittently for joint pain and swelling.  Pain in both hands -he complains of intermittent swelling in his hands.  He had only DIP thickening on examination.  Plan: XR Hand 2 View Right, XR Hand 2 View Left.  X-ray of bilateral hands were consistent with osteoarthritis.  Chronic pain of both knees -he had warmth and swelling in his right knee joint.  Plan: XR KNEE 3 VIEW RIGHT, XR KNEE 3 VIEW LEFT.  Patient has severe osteoarthritis in bilateral knee joints and severe chondromalacia patella.  Pain in both feet -he complains of feet discomfort.  No swelling or synovitis was noted.  Plan: XR Foot 2 Views Right, XR Foot 2 Views Left.  The x-ray of bilateral feet were consistent with osteoarthritis.  DDD (degenerative disc disease), lumbar-patient gives history of degenerative disease of lumbar spine.  Other medical problems are listed as follows:  History of diverticulitis  History of coronary artery disease  Essential hypertension  Dyslipidemia  History of diabetes mellitus, type II  History of gastroesophageal reflux (GERD)  History of vertigo  History of right bundle branch block (RBBB)  History of myocardial infarction  History of COPD   Orders: Orders Placed This Encounter  Procedures  . XR Hand 2 View Right  . XR Hand 2 View Left  . XR Foot 2 Views Right  . XR Foot 2 Views Left  . XR KNEE 3 VIEW RIGHT  . XR KNEE 3 VIEW LEFT  . CBC with Differential/Platelet  . COMPLETE METABOLIC PANEL WITH GFR  . Urinalysis, Routine w reflex microscopic  . Sedimentation rate  . Uric acid  . CK  . Rheumatoid factor  . Angiotensin converting enzyme  . HLA-B27 antigen  . Cyclic citrul peptide antibody, IgG  . ANA  . Glucose 6 phosphate dehydrogenase   No  orders of the defined types were placed in this encounter.   Face-to-face time spent with patient was 50 minutes. Greater than 50% of time was spent in counseling and coordination of care.  Follow-Up Instructions: Return for Polyarthralgia.   Bo Merino, MD  Note - This record has been created using Editor, commissioning.  Chart creation errors have been sought, but may not always  have been located. Such creation errors do not reflect on  the standard of medical care.

## 2018-01-17 ENCOUNTER — Ambulatory Visit (INDEPENDENT_AMBULATORY_CARE_PROVIDER_SITE_OTHER): Payer: Self-pay

## 2018-01-17 ENCOUNTER — Ambulatory Visit (INDEPENDENT_AMBULATORY_CARE_PROVIDER_SITE_OTHER): Payer: Medicare Other

## 2018-01-17 ENCOUNTER — Ambulatory Visit (INDEPENDENT_AMBULATORY_CARE_PROVIDER_SITE_OTHER): Payer: Medicare Other | Admitting: Rheumatology

## 2018-01-17 ENCOUNTER — Encounter: Payer: Self-pay | Admitting: Rheumatology

## 2018-01-17 VITALS — BP 153/81 | HR 65 | Resp 15 | Ht 66.14 in | Wt 196.0 lb

## 2018-01-17 DIAGNOSIS — Z8709 Personal history of other diseases of the respiratory system: Secondary | ICD-10-CM | POA: Diagnosis not present

## 2018-01-17 DIAGNOSIS — Z8719 Personal history of other diseases of the digestive system: Secondary | ICD-10-CM | POA: Diagnosis not present

## 2018-01-17 DIAGNOSIS — E785 Hyperlipidemia, unspecified: Secondary | ICD-10-CM | POA: Diagnosis not present

## 2018-01-17 DIAGNOSIS — M79671 Pain in right foot: Secondary | ICD-10-CM

## 2018-01-17 DIAGNOSIS — I10 Essential (primary) hypertension: Secondary | ICD-10-CM | POA: Diagnosis not present

## 2018-01-17 DIAGNOSIS — I252 Old myocardial infarction: Secondary | ICD-10-CM | POA: Diagnosis not present

## 2018-01-17 DIAGNOSIS — M79642 Pain in left hand: Secondary | ICD-10-CM

## 2018-01-17 DIAGNOSIS — R5383 Other fatigue: Secondary | ICD-10-CM | POA: Diagnosis not present

## 2018-01-17 DIAGNOSIS — Z87898 Personal history of other specified conditions: Secondary | ICD-10-CM

## 2018-01-17 DIAGNOSIS — M79672 Pain in left foot: Secondary | ICD-10-CM | POA: Diagnosis not present

## 2018-01-17 DIAGNOSIS — M25561 Pain in right knee: Secondary | ICD-10-CM

## 2018-01-17 DIAGNOSIS — M25562 Pain in left knee: Secondary | ICD-10-CM | POA: Diagnosis not present

## 2018-01-17 DIAGNOSIS — M79641 Pain in right hand: Secondary | ICD-10-CM | POA: Diagnosis not present

## 2018-01-17 DIAGNOSIS — Z8639 Personal history of other endocrine, nutritional and metabolic disease: Secondary | ICD-10-CM | POA: Diagnosis not present

## 2018-01-17 DIAGNOSIS — M5136 Other intervertebral disc degeneration, lumbar region: Secondary | ICD-10-CM | POA: Diagnosis not present

## 2018-01-17 DIAGNOSIS — Z8679 Personal history of other diseases of the circulatory system: Secondary | ICD-10-CM

## 2018-01-17 DIAGNOSIS — G8929 Other chronic pain: Secondary | ICD-10-CM

## 2018-01-21 LAB — CBC WITH DIFFERENTIAL/PLATELET
Basophils Absolute: 52 cells/uL (ref 0–200)
Basophils Relative: 0.9 %
EOS PCT: 1 %
Eosinophils Absolute: 58 cells/uL (ref 15–500)
HEMATOCRIT: 42.5 % (ref 38.5–50.0)
Hemoglobin: 14.5 g/dL (ref 13.2–17.1)
LYMPHS ABS: 1015 {cells}/uL (ref 850–3900)
MCH: 28.1 pg (ref 27.0–33.0)
MCHC: 34.1 g/dL (ref 32.0–36.0)
MCV: 82.4 fL (ref 80.0–100.0)
MONOS PCT: 9.1 %
MPV: 9.4 fL (ref 7.5–12.5)
NEUTROS PCT: 71.5 %
Neutro Abs: 4147 cells/uL (ref 1500–7800)
PLATELETS: 163 10*3/uL (ref 140–400)
RBC: 5.16 10*6/uL (ref 4.20–5.80)
RDW: 14.6 % (ref 11.0–15.0)
Total Lymphocyte: 17.5 %
WBC mixed population: 528 cells/uL (ref 200–950)
WBC: 5.8 10*3/uL (ref 3.8–10.8)

## 2018-01-21 LAB — COMPLETE METABOLIC PANEL WITH GFR
AG RATIO: 1.6 (calc) (ref 1.0–2.5)
ALKALINE PHOSPHATASE (APISO): 75 U/L (ref 40–115)
ALT: 17 U/L (ref 9–46)
AST: 16 U/L (ref 10–35)
Albumin: 4.2 g/dL (ref 3.6–5.1)
BILIRUBIN TOTAL: 0.7 mg/dL (ref 0.2–1.2)
BUN: 17 mg/dL (ref 7–25)
CHLORIDE: 105 mmol/L (ref 98–110)
CO2: 28 mmol/L (ref 20–32)
Calcium: 9.9 mg/dL (ref 8.6–10.3)
Creat: 0.85 mg/dL (ref 0.70–1.18)
GFR, Est African American: 100 mL/min/{1.73_m2} (ref >=60)
GFR, Est Non African American: 86 mL/min/{1.73_m2} (ref >=60)
GLOBULIN: 2.6 g/dL (ref 1.9–3.7)
Glucose, Bld: 108 mg/dL — ABNORMAL HIGH (ref 65–99)
POTASSIUM: 3.8 mmol/L (ref 3.5–5.3)
SODIUM: 141 mmol/L (ref 135–146)
Total Protein: 6.8 g/dL (ref 6.1–8.1)

## 2018-01-21 LAB — URINALYSIS, ROUTINE W REFLEX MICROSCOPIC
BILIRUBIN URINE: NEGATIVE
Glucose, UA: NEGATIVE
HGB URINE DIPSTICK: NEGATIVE
KETONES UR: NEGATIVE
Leukocytes, UA: NEGATIVE
NITRITE: NEGATIVE
Protein, ur: NEGATIVE
Specific Gravity, Urine: 1.013 (ref 1.001–1.03)
pH: 6.5 (ref 5.0–8.0)

## 2018-01-21 LAB — SEDIMENTATION RATE: SED RATE: 9 mm/h (ref 0–20)

## 2018-01-21 LAB — CK: CK TOTAL: 58 U/L (ref 44–196)

## 2018-01-21 LAB — ANGIOTENSIN CONVERTING ENZYME: Angiotensin-Converting Enzyme: 26 U/L (ref 9–67)

## 2018-01-21 LAB — RHEUMATOID FACTOR: Rhuematoid fact SerPl-aCnc: <14 IU/mL (ref <14)

## 2018-01-21 LAB — URIC ACID: URIC ACID, SERUM: 4.5 mg/dL (ref 4.0–8.0)

## 2018-01-21 LAB — CYCLIC CITRUL PEPTIDE ANTIBODY, IGG: Cyclic Citrullin Peptide Ab: <16 UNITS

## 2018-01-21 LAB — ANA: Anti Nuclear Antibody(ANA): NEGATIVE

## 2018-01-21 LAB — GLUCOSE 6 PHOSPHATE DEHYDROGENASE: G-6PDH: 19 U/g Hgb (ref 7.0–20.5)

## 2018-01-21 LAB — HLA-B27 ANTIGEN: HLA-B27 Antigen: NEGATIVE

## 2018-02-04 DIAGNOSIS — M5136 Other intervertebral disc degeneration, lumbar region: Secondary | ICD-10-CM | POA: Insufficient documentation

## 2018-02-04 DIAGNOSIS — Z8679 Personal history of other diseases of the circulatory system: Secondary | ICD-10-CM | POA: Insufficient documentation

## 2018-02-04 DIAGNOSIS — Z7952 Long term (current) use of systemic steroids: Secondary | ICD-10-CM | POA: Insufficient documentation

## 2018-02-04 DIAGNOSIS — Z87898 Personal history of other specified conditions: Secondary | ICD-10-CM | POA: Insufficient documentation

## 2018-02-04 DIAGNOSIS — M19072 Primary osteoarthritis, left ankle and foot: Secondary | ICD-10-CM

## 2018-02-04 DIAGNOSIS — Z8719 Personal history of other diseases of the digestive system: Secondary | ICD-10-CM | POA: Insufficient documentation

## 2018-02-04 DIAGNOSIS — M19071 Primary osteoarthritis, right ankle and foot: Secondary | ICD-10-CM | POA: Insufficient documentation

## 2018-02-04 DIAGNOSIS — Z8709 Personal history of other diseases of the respiratory system: Secondary | ICD-10-CM | POA: Insufficient documentation

## 2018-02-04 DIAGNOSIS — M19042 Primary osteoarthritis, left hand: Principal | ICD-10-CM

## 2018-02-04 DIAGNOSIS — Z8639 Personal history of other endocrine, nutritional and metabolic disease: Secondary | ICD-10-CM | POA: Insufficient documentation

## 2018-02-04 DIAGNOSIS — M170 Bilateral primary osteoarthritis of knee: Secondary | ICD-10-CM | POA: Insufficient documentation

## 2018-02-04 DIAGNOSIS — I10 Essential (primary) hypertension: Secondary | ICD-10-CM | POA: Insufficient documentation

## 2018-02-04 DIAGNOSIS — E785 Hyperlipidemia, unspecified: Secondary | ICD-10-CM | POA: Insufficient documentation

## 2018-02-04 DIAGNOSIS — M19041 Primary osteoarthritis, right hand: Secondary | ICD-10-CM | POA: Insufficient documentation

## 2018-02-04 NOTE — Progress Notes (Signed)
Office Visit Note  Patient: Patrick Klein             Date of Birth: 1945-06-08           MRN: [id number]             PCP: [facility] Referring: [facility]* Visit Date: 02/18/2018 Occupation: @GUAROCC @  Subjective:  Follow-up (no issues, doing good)   History of Present Illness: Patrick Klein is a 72 y.o. male with history of osteoarthritis and inflammatory arthritis.  He states he continues to have some pain and discomfort in the joints.  He describes pain in his bilateral knee joints.  He has occasional discomfort in his hands especially in the [facility] joints.  Denies any discomfort in his feet currently.  His lower back pain is better after he went to a chiropractor.  He has been exercising on a regular basis.  Activities of Daily Living:  Patient reports morning stiffness for 0 minute.   Patient Reports nocturnal pain.  Difficulty dressing/grooming: Denies Difficulty climbing stairs: Reports Difficulty getting out of chair: Reports Difficulty using hands for taps, buttons, cutlery, and/or writing: Reports  Review of Systems  Constitutional: Negative for fatigue.  HENT: Negative for ear ringing, mouth dryness and nose dryness.   Eyes: Negative for redness and dryness.  Respiratory: Negative for apnea and difficulty breathing.   Cardiovascular: Positive for hypertension. Negative for palpitations and swelling in legs/feet.  Gastrointestinal: Negative for blood in stool, constipation and diarrhea.  Endocrine: Negative for increased urination.  Musculoskeletal: Positive for arthralgias, joint pain, myalgias and myalgias. Negative for joint swelling, muscle weakness and morning stiffness.  Skin: Negative for rash, hair loss and sensitivity to sunlight.  Allergic/Immunologic: Negative for susceptible to infections.  Neurological: Positive for weakness. Negative for dizziness, numbness, headaches and memory loss.  Hematological: Negative for swollen  glands.  Psychiatric/Behavioral: Positive for sleep disturbance. Negative for depressed mood. The patient is nervous/anxious.     PMFS History:  Patient Active Problem List   Diagnosis Date Noted  . Primary osteoarthritis of both hands 02/04/2018  . Primary osteoarthritis of both knees 02/04/2018  . Primary osteoarthritis of both feet 02/04/2018  . Long term (current) use of systemic steroids 02/04/2018  . DDD (degenerative disc disease), lumbar 02/04/2018  . Dyslipidemia 02/04/2018  . History of diabetes mellitus 02/04/2018  . Essential hypertension 02/04/2018  . History of right bundle branch block 02/04/2018  . History of diverticulitis 02/04/2018  . History of gastroesophageal reflux (GERD) 02/04/2018  . History of COPD 02/04/2018  . History of vertigo 02/04/2018    Past Medical History:  Diagnosis Date  . Arthritis   . Diabetes mellitus without complication ([place])   . Diverticulitis   . Hypertension     Family History  Problem Relation Age of Onset  . Stroke Mother   . Mitral valve prolapse Mother   . Heart attack Father   . Alcoholism Father   . Alcoholism Brother   . Congestive Heart Failure Brother   . Alcoholism Brother   . Congestive Heart Failure Brother   . Anxiety disorder Daughter   . Healthy Daughter    Past Surgical History:  Procedure Laterality Date  . CORONARY ANGIOPLASTY WITH STENT PLACEMENT    . HAND SURGERY Right 1986   reconstructive surgery on fingers   . HERNIA REPAIR     x2   Social History   Social History Narrative  . Not on file    Objective: Vital  Signs: BP (!) 151/83 (BP Location: Left Arm, Patient Position: Sitting, Cuff Size: Normal)   Pulse 70   Ht 5' 8"  (1.727 m)   Wt 196 lb (88.9 kg)   BMI 29.80 kg/m    Physical Exam  Constitutional: He is oriented to person, place, and time. He appears well-developed and well-nourished.  HENT:  Head: Normocephalic and atraumatic.  Eyes: Pupils are equal, round, and reactive to  light. Conjunctivae and EOM are normal.  Neck: Normal range of motion. Neck supple.  Cardiovascular: Normal rate, regular rhythm and normal heart sounds.  Pulmonary/Chest: Effort normal and breath sounds normal.  Abdominal: Soft. Bowel sounds are normal.  Neurological: He is alert and oriented to person, place, and time.  Skin: Skin is warm and dry. Capillary refill takes less than 2 seconds.  Psychiatric: He has a normal mood and affect. His behavior is normal.  Nursing note and vitals reviewed.    Musculoskeletal Exam: C-spine thoracic spine with some good range of motion.  He has some limitation with range of motion of his lumbar spine.  Shoulder joints elbow joints wrist joints were in good range of motion.  He has DIP and PIP thickening in his hands and feet consistent with osteoarthritis.  Is crepitus in his bilateral knee joints without any warmth swelling or effusion.  CDAI Exam: CDAI Score: Not documented Patient Global Assessment: Not documented; Provider Global Assessment: Not documented Swollen: Not documented; Tender: Not documented Joint Exam   Not documented   There is currently no information documented on the homunculus. Go to the Rheumatology activity and complete the homunculus joint exam.  Investigation: No additional findings.  Imaging: No results found.  Recent Labs: Lab Results  Component Value Date   WBC 5.8 01/17/2018   HGB 14.5 01/17/2018   PLT 163 01/17/2018   NA 141 01/17/2018   K 3.8 01/17/2018   CL 105 01/17/2018   CO2 28 01/17/2018   GLUCOSE 108 (H) 01/17/2018   BUN 17 01/17/2018   CREATININE 0.85 01/17/2018   BILITOT 0.7 01/17/2018   AST 16 01/17/2018   ALT 17 01/17/2018   PROT 6.8 01/17/2018   CALCIUM 9.9 01/17/2018   GFRAA 100 01/17/2018  UA negative, ESR 9, CK 58, ANA negative, RF negative, anti-CCP negative, ACE negative, HLA-B27 negative, G6PD normal  Speciality Comments: No specialty comments available.  Procedures:  No  procedures performed Allergies: Ivp dye [iodinated diagnostic agents]   Assessment / Plan:     Visit Diagnoses: Primary osteoarthritis of both hands-he has off-and-on discomfort in his CMC joint.  No synovitis was noted.  Primary osteoarthritis of both knees - Bilateral severe with severe chondromalacia patella.  He continues to have chronic pain and discomfort in his knee joints.  I discussed total knee replacement but he declined.  Primary osteoarthritis of both feet-currently not having any discomfort.  Inflammatory arthritis - Diagnosed with rheumatoid arthritis 15 years ago by rheumatologist.  Never treated with DMARDs.  He takes prednisone as needed.  Patient describes taking prednisone for 5 days every 3 months.  I do not see any clinical synovitis.  His x-rays were consistent with osteoarthritis.  I am doubtful about the diagnosis of rheumatoid arthritis.  I have advised him not to use prednisone.  A list of natural anti-inflammatories were discussed.  Long term (current) use of systemic steroids-use of prednisone was discouraged.  I have advised him to contact me in case he develops any joint swelling.  DDD (degenerative disc disease), lumbar-he  is doing better after chiropractor care.  Other medical problems are listed as follows:  Dyslipidemia  History of diabetes mellitus  Essential hypertension  History of coronary artery disease - S/P post MI  History of diverticulitis  History of gastroesophageal reflux (GERD)  History of COPD  History of vertigo  History of right bundle branch block   Orders: No orders of the defined types were placed in this encounter.  No orders of the defined types were placed in this encounter.   Face-to-face time spent with patient was 30 minutes. Greater than 50% of time was spent in counseling and coordination of care.  Follow-Up Instructions: Return in about 6 months (around 08/19/2018) for Osteoarthritis, DDD.   Bo Merino,  MD  Note - This record has been created using Editor, commissioning.  Chart creation errors have been sought, but may not always  have been located. Such creation errors do not reflect on  the standard of medical care.

## 2018-02-18 ENCOUNTER — Ambulatory Visit (INDEPENDENT_AMBULATORY_CARE_PROVIDER_SITE_OTHER): Payer: Medicare Other | Admitting: Rheumatology

## 2018-02-18 ENCOUNTER — Encounter: Payer: Self-pay | Admitting: Rheumatology

## 2018-02-18 VITALS — BP 151/83 | HR 70 | Ht 68.0 in | Wt 196.0 lb

## 2018-02-18 DIAGNOSIS — M170 Bilateral primary osteoarthritis of knee: Secondary | ICD-10-CM | POA: Diagnosis not present

## 2018-02-18 DIAGNOSIS — M19071 Primary osteoarthritis, right ankle and foot: Secondary | ICD-10-CM

## 2018-02-18 DIAGNOSIS — Z8679 Personal history of other diseases of the circulatory system: Secondary | ICD-10-CM

## 2018-02-18 DIAGNOSIS — M19042 Primary osteoarthritis, left hand: Secondary | ICD-10-CM

## 2018-02-18 DIAGNOSIS — M5136 Other intervertebral disc degeneration, lumbar region: Secondary | ICD-10-CM | POA: Diagnosis not present

## 2018-02-18 DIAGNOSIS — Z8709 Personal history of other diseases of the respiratory system: Secondary | ICD-10-CM

## 2018-02-18 DIAGNOSIS — M1990 Unspecified osteoarthritis, unspecified site: Secondary | ICD-10-CM | POA: Diagnosis not present

## 2018-02-18 DIAGNOSIS — I10 Essential (primary) hypertension: Secondary | ICD-10-CM | POA: Diagnosis not present

## 2018-02-18 DIAGNOSIS — M51369 Other intervertebral disc degeneration, lumbar region without mention of lumbar back pain or lower extremity pain: Secondary | ICD-10-CM

## 2018-02-18 DIAGNOSIS — Z7952 Long term (current) use of systemic steroids: Secondary | ICD-10-CM | POA: Diagnosis not present

## 2018-02-18 DIAGNOSIS — Z8639 Personal history of other endocrine, nutritional and metabolic disease: Secondary | ICD-10-CM

## 2018-02-18 DIAGNOSIS — Z8719 Personal history of other diseases of the digestive system: Secondary | ICD-10-CM

## 2018-02-18 DIAGNOSIS — M1380 Other specified arthritis, unspecified site: Secondary | ICD-10-CM

## 2018-02-18 DIAGNOSIS — M19041 Primary osteoarthritis, right hand: Secondary | ICD-10-CM | POA: Diagnosis not present

## 2018-02-18 DIAGNOSIS — Z87898 Personal history of other specified conditions: Secondary | ICD-10-CM

## 2018-02-18 DIAGNOSIS — M19072 Primary osteoarthritis, left ankle and foot: Secondary | ICD-10-CM

## 2018-02-18 DIAGNOSIS — E785 Hyperlipidemia, unspecified: Secondary | ICD-10-CM

## 2018-02-18 NOTE — Patient Instructions (Signed)
Natural anti-inflammatories  You can purchase these at [employer], [employer] or online.  . Turmeric (capsules)  . Ginger (ginger root or capsules)  . Omega 3 (Fish, flax seeds, chia seeds, walnuts, almonds)  . Tart cherry (dried or extract)   Patient should be under the care of a physician while taking these supplements. This may not be reproduced without the permission of Dr. Manav Pierotti.  

## 2018-03-08 DIAGNOSIS — E785 Hyperlipidemia, unspecified: Secondary | ICD-10-CM | POA: Diagnosis not present

## 2018-03-08 DIAGNOSIS — F419 Anxiety disorder, unspecified: Secondary | ICD-10-CM | POA: Diagnosis not present

## 2018-03-08 DIAGNOSIS — E1165 Type 2 diabetes mellitus with hyperglycemia: Secondary | ICD-10-CM | POA: Diagnosis not present

## 2018-03-08 DIAGNOSIS — Z79899 Other long term (current) drug therapy: Secondary | ICD-10-CM | POA: Diagnosis not present

## 2018-03-08 DIAGNOSIS — M1710 Unilateral primary osteoarthritis, unspecified knee: Secondary | ICD-10-CM | POA: Diagnosis not present

## 2018-05-08 DIAGNOSIS — M1710 Unilateral primary osteoarthritis, unspecified knee: Secondary | ICD-10-CM | POA: Diagnosis not present

## 2018-05-08 DIAGNOSIS — I739 Peripheral vascular disease, unspecified: Secondary | ICD-10-CM | POA: Diagnosis not present

## 2018-05-08 DIAGNOSIS — F419 Anxiety disorder, unspecified: Secondary | ICD-10-CM | POA: Diagnosis not present

## 2018-05-08 DIAGNOSIS — I10 Essential (primary) hypertension: Secondary | ICD-10-CM | POA: Diagnosis not present

## 2018-05-16 DIAGNOSIS — R5383 Other fatigue: Secondary | ICD-10-CM | POA: Diagnosis not present

## 2018-05-16 DIAGNOSIS — I739 Peripheral vascular disease, unspecified: Secondary | ICD-10-CM | POA: Diagnosis not present

## 2018-07-11 DIAGNOSIS — F419 Anxiety disorder, unspecified: Secondary | ICD-10-CM | POA: Diagnosis not present

## 2018-07-11 DIAGNOSIS — E1165 Type 2 diabetes mellitus with hyperglycemia: Secondary | ICD-10-CM | POA: Diagnosis not present

## 2018-07-11 DIAGNOSIS — Z79899 Other long term (current) drug therapy: Secondary | ICD-10-CM | POA: Diagnosis not present

## 2018-07-11 DIAGNOSIS — E1169 Type 2 diabetes mellitus with other specified complication: Secondary | ICD-10-CM | POA: Diagnosis not present

## 2018-07-11 DIAGNOSIS — I10 Essential (primary) hypertension: Secondary | ICD-10-CM | POA: Diagnosis not present

## 2018-08-06 NOTE — Progress Notes (Deleted)
Office Visit Note  Patient: Patrick Klein             Date of Birth: August 06, 1945           MRN: [id number]             PCP: [facility] Referring: [facility]* Visit Date: 08/20/2018 Occupation: @GUAROCC @  Subjective:  No chief complaint on file.   History of Present Illness: Patrick Klein is a 73 y.o. male ***   Activities of Daily Living:  Patient reports morning stiffness for *** {minute/hour:19697}.   Patient {ACTIONS;DENIES/REPORTS:21021675::"Denies"} nocturnal pain.  Difficulty dressing/grooming: {ACTIONS;DENIES/REPORTS:21021675::"Denies"} Difficulty climbing stairs: {ACTIONS;DENIES/REPORTS:21021675::"Denies"} Difficulty getting out of chair: {ACTIONS;DENIES/REPORTS:21021675::"Denies"} Difficulty using hands for taps, buttons, cutlery, and/or writing: {ACTIONS;DENIES/REPORTS:21021675::"Denies"}  No Rheumatology ROS completed.   PMFS History:  Patient Active Problem List   Diagnosis Date Noted  . Primary osteoarthritis of both hands 02/04/2018  . Primary osteoarthritis of both knees 02/04/2018  . Primary osteoarthritis of both feet 02/04/2018  . Long term (current) use of systemic steroids 02/04/2018  . DDD (degenerative disc disease), lumbar 02/04/2018  . Dyslipidemia 02/04/2018  . History of diabetes mellitus 02/04/2018  . Essential hypertension 02/04/2018  . History of right bundle branch block 02/04/2018  . History of diverticulitis 02/04/2018  . History of gastroesophageal reflux (GERD) 02/04/2018  . History of COPD 02/04/2018  . History of vertigo 02/04/2018    Past Medical History:  Diagnosis Date  . Arthritis   . Diabetes mellitus without complication ([facility])   . Diverticulitis   . Hypertension     Family History  Problem Relation Age of Onset  . Stroke Mother   . Mitral valve prolapse Mother   . Heart attack Father   . Alcoholism Father   . Alcoholism Brother   . Congestive Heart Failure Brother   . Alcoholism  Brother   . Congestive Heart Failure Brother   . Anxiety disorder Daughter   . Healthy Daughter    Past Surgical History:  Procedure Laterality Date  . CORONARY ANGIOPLASTY WITH STENT PLACEMENT    . HAND SURGERY Right 1986   reconstructive surgery on fingers   . HERNIA REPAIR     x2   Social History   Social History Narrative  . Not on file    There is no immunization history on file for this patient.   Objective: Vital Signs: There were no vitals taken for this visit.   Physical Exam   Musculoskeletal Exam: ***  CDAI Exam: CDAI Score: Not documented Patient Global Assessment: Not documented; Provider Global Assessment: Not documented Swollen: Not documented; Tender: Not documented Joint Exam   Not documented   There is currently no information documented on the homunculus. Go to the Rheumatology activity and complete the homunculus joint exam.  Investigation: No additional findings.  Imaging: No results found.  Recent Labs: Lab Results  Component Value Date   WBC 5.8 01/17/2018   HGB 14.5 01/17/2018   PLT 163 01/17/2018   NA 141 01/17/2018   K 3.8 01/17/2018   CL 105 01/17/2018   CO2 28 01/17/2018   GLUCOSE 108 (H) 01/17/2018   BUN 17 01/17/2018   CREATININE 0.85 01/17/2018   BILITOT 0.7 01/17/2018   AST 16 01/17/2018   ALT 17 01/17/2018   PROT 6.8 01/17/2018   CALCIUM 9.9 01/17/2018   GFRAA 100 01/17/2018    Speciality Comments: No specialty comments available.  Procedures:  No procedures performed Allergies: Ivp dye [iodinated diagnostic agents]  Assessment / Plan:     Visit Diagnoses: No diagnosis found.   Orders: No orders of the defined types were placed in this encounter.  No orders of the defined types were placed in this encounter.   Face-to-face time spent with patient was *** minutes. Greater than 50% of time was spent in counseling and coordination of care.  Follow-Up Instructions: No follow-ups on file.   Ellen Henri, CMA  Note - This record has been created using Animal nutritionist.  Chart creation errors have been sought, but may not always  have been located. Such creation errors do not reflect on  the standard of medical care.

## 2018-08-20 ENCOUNTER — Ambulatory Visit: Payer: Medicare Other | Admitting: Rheumatology

## 2018-09-17 DIAGNOSIS — Z125 Encounter for screening for malignant neoplasm of prostate: Secondary | ICD-10-CM | POA: Diagnosis not present

## 2018-09-17 DIAGNOSIS — Z1331 Encounter for screening for depression: Secondary | ICD-10-CM | POA: Diagnosis not present

## 2018-09-17 DIAGNOSIS — Z1211 Encounter for screening for malignant neoplasm of colon: Secondary | ICD-10-CM | POA: Diagnosis not present

## 2018-09-17 DIAGNOSIS — E785 Hyperlipidemia, unspecified: Secondary | ICD-10-CM | POA: Diagnosis not present

## 2018-09-17 DIAGNOSIS — Z0000 Encounter for general adult medical examination without abnormal findings: Secondary | ICD-10-CM | POA: Diagnosis not present

## 2018-09-17 DIAGNOSIS — Z9181 History of falling: Secondary | ICD-10-CM | POA: Diagnosis not present

## 2018-10-23 DIAGNOSIS — Z79899 Other long term (current) drug therapy: Secondary | ICD-10-CM | POA: Diagnosis not present

## 2018-10-23 DIAGNOSIS — E785 Hyperlipidemia, unspecified: Secondary | ICD-10-CM | POA: Diagnosis not present

## 2018-10-23 DIAGNOSIS — E118 Type 2 diabetes mellitus with unspecified complications: Secondary | ICD-10-CM | POA: Diagnosis not present

## 2018-10-23 DIAGNOSIS — I10 Essential (primary) hypertension: Secondary | ICD-10-CM | POA: Diagnosis not present

## 2018-10-23 DIAGNOSIS — F419 Anxiety disorder, unspecified: Secondary | ICD-10-CM | POA: Diagnosis not present

## 2018-12-25 DIAGNOSIS — I10 Essential (primary) hypertension: Secondary | ICD-10-CM | POA: Diagnosis not present

## 2018-12-25 DIAGNOSIS — F419 Anxiety disorder, unspecified: Secondary | ICD-10-CM | POA: Diagnosis not present

## 2018-12-25 DIAGNOSIS — Z6829 Body mass index (BMI) 29.0-29.9, adult: Secondary | ICD-10-CM | POA: Diagnosis not present

## 2018-12-25 DIAGNOSIS — Z125 Encounter for screening for malignant neoplasm of prostate: Secondary | ICD-10-CM | POA: Diagnosis not present

## 2019-02-28 DIAGNOSIS — Z125 Encounter for screening for malignant neoplasm of prostate: Secondary | ICD-10-CM | POA: Diagnosis not present

## 2019-02-28 DIAGNOSIS — E118 Type 2 diabetes mellitus with unspecified complications: Secondary | ICD-10-CM | POA: Diagnosis not present

## 2019-02-28 DIAGNOSIS — F419 Anxiety disorder, unspecified: Secondary | ICD-10-CM | POA: Diagnosis not present

## 2019-02-28 DIAGNOSIS — Z79899 Other long term (current) drug therapy: Secondary | ICD-10-CM | POA: Diagnosis not present

## 2019-02-28 DIAGNOSIS — E785 Hyperlipidemia, unspecified: Secondary | ICD-10-CM | POA: Diagnosis not present

## 2019-02-28 DIAGNOSIS — R5383 Other fatigue: Secondary | ICD-10-CM | POA: Diagnosis not present

## 2019-03-17 DIAGNOSIS — R972 Elevated prostate specific antigen [PSA]: Secondary | ICD-10-CM | POA: Diagnosis not present

## 2019-03-17 DIAGNOSIS — N401 Enlarged prostate with lower urinary tract symptoms: Secondary | ICD-10-CM | POA: Diagnosis not present

## 2019-05-02 DIAGNOSIS — G4710 Hypersomnia, unspecified: Secondary | ICD-10-CM | POA: Diagnosis not present

## 2019-05-02 DIAGNOSIS — E118 Type 2 diabetes mellitus with unspecified complications: Secondary | ICD-10-CM | POA: Diagnosis not present

## 2019-05-02 DIAGNOSIS — F419 Anxiety disorder, unspecified: Secondary | ICD-10-CM | POA: Diagnosis not present

## 2019-05-02 DIAGNOSIS — I10 Essential (primary) hypertension: Secondary | ICD-10-CM | POA: Diagnosis not present

## 2019-07-13 DIAGNOSIS — Z20828 Contact with and (suspected) exposure to other viral communicable diseases: Secondary | ICD-10-CM | POA: Diagnosis not present

## 2019-07-23 ENCOUNTER — Telehealth: Payer: Self-pay | Admitting: Emergency Medicine

## 2019-07-23 ENCOUNTER — Inpatient Hospital Stay (HOSPITAL_BASED_OUTPATIENT_CLINIC_OR_DEPARTMENT_OTHER)
Admission: EM | Admit: 2019-07-23 | Discharge: 2019-07-25 | DRG: 177 | Disposition: A | Discharge status: Home / Self Care | Payer: Medicare Other | Attending: Internal Medicine | Admitting: Internal Medicine

## 2019-07-23 ENCOUNTER — Encounter (HOSPITAL_BASED_OUTPATIENT_CLINIC_OR_DEPARTMENT_OTHER): Payer: Self-pay | Admitting: Emergency Medicine

## 2019-07-23 ENCOUNTER — Other Ambulatory Visit: Payer: Self-pay

## 2019-07-23 ENCOUNTER — Emergency Department (HOSPITAL_BASED_OUTPATIENT_CLINIC_OR_DEPARTMENT_OTHER): Payer: Medicare Other

## 2019-07-23 ENCOUNTER — Telehealth: Payer: Self-pay

## 2019-07-23 DIAGNOSIS — Z818 Family history of other mental and behavioral disorders: Secondary | ICD-10-CM | POA: Diagnosis not present

## 2019-07-23 DIAGNOSIS — Z823 Family history of stroke: Secondary | ICD-10-CM

## 2019-07-23 DIAGNOSIS — E785 Hyperlipidemia, unspecified: Secondary | ICD-10-CM | POA: Diagnosis present

## 2019-07-23 DIAGNOSIS — Z79899 Other long term (current) drug therapy: Secondary | ICD-10-CM | POA: Diagnosis not present

## 2019-07-23 DIAGNOSIS — I10 Essential (primary) hypertension: Secondary | ICD-10-CM | POA: Diagnosis present

## 2019-07-23 DIAGNOSIS — J9601 Acute respiratory failure with hypoxia: Secondary | ICD-10-CM | POA: Diagnosis present

## 2019-07-23 DIAGNOSIS — Z7982 Long term (current) use of aspirin: Secondary | ICD-10-CM

## 2019-07-23 DIAGNOSIS — Z955 Presence of coronary angioplasty implant and graft: Secondary | ICD-10-CM | POA: Diagnosis not present

## 2019-07-23 DIAGNOSIS — E119 Type 2 diabetes mellitus without complications: Secondary | ICD-10-CM | POA: Diagnosis present

## 2019-07-23 DIAGNOSIS — I2510 Atherosclerotic heart disease of native coronary artery without angina pectoris: Secondary | ICD-10-CM | POA: Diagnosis present

## 2019-07-23 DIAGNOSIS — M1990 Unspecified osteoarthritis, unspecified site: Secondary | ICD-10-CM | POA: Diagnosis present

## 2019-07-23 DIAGNOSIS — K219 Gastro-esophageal reflux disease without esophagitis: Secondary | ICD-10-CM | POA: Diagnosis present

## 2019-07-23 DIAGNOSIS — Z811 Family history of alcohol abuse and dependence: Secondary | ICD-10-CM | POA: Diagnosis not present

## 2019-07-23 DIAGNOSIS — U071 COVID-19: Principal | ICD-10-CM | POA: Diagnosis present

## 2019-07-23 DIAGNOSIS — M5136 Other intervertebral disc degeneration, lumbar region: Secondary | ICD-10-CM | POA: Diagnosis present

## 2019-07-23 DIAGNOSIS — J1282 Pneumonia due to coronavirus disease 2019: Secondary | ICD-10-CM | POA: Diagnosis present

## 2019-07-23 DIAGNOSIS — H9190 Unspecified hearing loss, unspecified ear: Secondary | ICD-10-CM | POA: Diagnosis present

## 2019-07-23 DIAGNOSIS — I4510 Unspecified right bundle-branch block: Secondary | ICD-10-CM | POA: Diagnosis present

## 2019-07-23 DIAGNOSIS — Z87891 Personal history of nicotine dependence: Secondary | ICD-10-CM | POA: Diagnosis not present

## 2019-07-23 DIAGNOSIS — M159 Polyosteoarthritis, unspecified: Secondary | ICD-10-CM | POA: Diagnosis present

## 2019-07-23 DIAGNOSIS — Z8249 Family history of ischemic heart disease and other diseases of the circulatory system: Secondary | ICD-10-CM

## 2019-07-23 DIAGNOSIS — J440 Chronic obstructive pulmonary disease with acute lower respiratory infection: Secondary | ICD-10-CM | POA: Diagnosis present

## 2019-07-23 DIAGNOSIS — Z91041 Radiographic dye allergy status: Secondary | ICD-10-CM | POA: Diagnosis not present

## 2019-07-23 DIAGNOSIS — R509 Fever, unspecified: Secondary | ICD-10-CM | POA: Diagnosis not present

## 2019-07-23 DIAGNOSIS — Z794 Long term (current) use of insulin: Secondary | ICD-10-CM | POA: Diagnosis not present

## 2019-07-23 HISTORY — DX: Unspecified hearing loss, unspecified ear: H91.90

## 2019-07-23 HISTORY — DX: Nonrheumatic mitral (valve) insufficiency: I34.0

## 2019-07-23 LAB — CBC WITH DIFFERENTIAL/PLATELET
Abs Immature Granulocytes: 0.01 10*3/uL (ref 0.00–0.07)
Basophils Absolute: 0 10*3/uL (ref 0.0–0.1)
Basophils Relative: 0 %
Eosinophils Absolute: 0 10*3/uL (ref 0.0–0.5)
Eosinophils Relative: 0 %
HCT: 40 % (ref 39.0–52.0)
Hemoglobin: 13.6 g/dL (ref 13.0–17.0)
Immature Granulocytes: 0 %
Lymphocytes Relative: 17 %
Lymphs Abs: 0.7 10*3/uL (ref 0.7–4.0)
MCH: 27 pg (ref 26.0–34.0)
MCHC: 34 g/dL (ref 30.0–36.0)
MCV: 79.4 fL — ABNORMAL LOW (ref 80.0–100.0)
Monocytes Absolute: 0.4 10*3/uL (ref 0.1–1.0)
Monocytes Relative: 9 %
Neutro Abs: 2.9 10*3/uL (ref 1.7–7.7)
Neutrophils Relative %: 74 %
Platelets: 148 10*3/uL — ABNORMAL LOW (ref 150–400)
RBC: 5.04 MIL/uL (ref 4.22–5.81)
RDW: 14.6 % (ref 11.5–15.5)
WBC: 3.9 10*3/uL — ABNORMAL LOW (ref 4.0–10.5)
nRBC: 0 % (ref 0.0–0.2)

## 2019-07-23 LAB — COMPREHENSIVE METABOLIC PANEL
ALT: 19 U/L (ref 0–44)
AST: 30 U/L (ref 15–41)
Albumin: 3.4 g/dL — ABNORMAL LOW (ref 3.5–5.0)
Alkaline Phosphatase: 51 U/L (ref 38–126)
Anion gap: 8 (ref 5–15)
BUN: 13 mg/dL (ref 8–23)
CO2: 27 mmol/L (ref 22–32)
Calcium: 8.4 mg/dL — ABNORMAL LOW (ref 8.9–10.3)
Chloride: 99 mmol/L (ref 98–111)
Creatinine, Ser: 0.67 mg/dL (ref 0.61–1.24)
GFR calc Af Amer: >60 mL/min (ref >60)
GFR calc non Af Amer: >60 mL/min (ref >60)
Glucose, Bld: 146 mg/dL — ABNORMAL HIGH (ref 70–99)
Potassium: 2.9 mmol/L — ABNORMAL LOW (ref 3.5–5.1)
Sodium: 134 mmol/L — ABNORMAL LOW (ref 135–145)
Total Bilirubin: 0.8 mg/dL (ref 0.3–1.2)
Total Protein: 6.8 g/dL (ref 6.5–8.1)

## 2019-07-23 LAB — LACTIC ACID, PLASMA: Lactic Acid, Venous: 1.1 mmol/L (ref 0.5–1.9)

## 2019-07-23 LAB — D-DIMER, QUANTITATIVE: D-Dimer, Quant: 2.1 ug/mL-FEU — ABNORMAL HIGH (ref 0.00–0.50)

## 2019-07-23 LAB — TRIGLYCERIDES: Triglycerides: 69 mg/dL (ref <150)

## 2019-07-23 LAB — C-REACTIVE PROTEIN: CRP: 6.2 mg/dL — ABNORMAL HIGH (ref <1.0)

## 2019-07-23 LAB — PROCALCITONIN: Procalcitonin: <0.1 ng/mL

## 2019-07-23 LAB — FIBRINOGEN: Fibrinogen: 585 mg/dL — ABNORMAL HIGH (ref 210–475)

## 2019-07-23 LAB — FERRITIN: Ferritin: 174 ng/mL (ref 24–336)

## 2019-07-23 LAB — GLUCOSE, CAPILLARY
Glucose-Capillary: 180 mg/dL — ABNORMAL HIGH (ref 70–99)
Glucose-Capillary: 273 mg/dL — ABNORMAL HIGH (ref 70–99)

## 2019-07-23 LAB — SARS CORONAVIRUS 2 AG (30 MIN TAT): SARS Coronavirus 2 Ag: NEGATIVE

## 2019-07-23 LAB — LACTATE DEHYDROGENASE: LDH: 375 U/L — ABNORMAL HIGH (ref 98–192)

## 2019-07-23 LAB — SARS CORONAVIRUS 2 (TAT 6-24 HRS): SARS Coronavirus 2: POSITIVE — AB

## 2019-07-23 MED ORDER — IRBESARTAN 150 MG PO TABS
150.0000 mg | ORAL_TABLET | Freq: Every day | ORAL | Status: DC
  Administered 2019-07-23 – 2019-07-25 (×3): 150 mg via ORAL
  Filled 2019-07-23 (×3): qty 1

## 2019-07-23 MED ORDER — DEXAMETHASONE SODIUM PHOSPHATE 10 MG/ML IJ SOLN
6.0000 mg | INTRAMUSCULAR | Status: DC
  Administered 2019-07-24 – 2019-07-25 (×2): 6 mg via INTRAVENOUS
  Filled 2019-07-23 (×2): qty 1

## 2019-07-23 MED ORDER — POTASSIUM CHLORIDE CRYS ER 20 MEQ PO TBCR
10.0000 meq | EXTENDED_RELEASE_TABLET | Freq: Every day | ORAL | Status: DC
  Administered 2019-07-23 – 2019-07-25 (×3): 10 meq via ORAL
  Filled 2019-07-23 (×3): qty 1

## 2019-07-23 MED ORDER — ENOXAPARIN SODIUM 40 MG/0.4ML ~~LOC~~ SOLN
40.0000 mg | SUBCUTANEOUS | Status: DC
  Administered 2019-07-23 – 2019-07-25 (×3): 40 mg via SUBCUTANEOUS
  Filled 2019-07-23 (×3): qty 0.4

## 2019-07-23 MED ORDER — HYDROCHLOROTHIAZIDE 12.5 MG PO CAPS
12.5000 mg | ORAL_CAPSULE | Freq: Every day | ORAL | Status: DC
  Administered 2019-07-23 – 2019-07-25 (×3): 12.5 mg via ORAL
  Filled 2019-07-23 (×3): qty 1

## 2019-07-23 MED ORDER — INSULIN ASPART 100 UNIT/ML ~~LOC~~ SOLN
0.0000 [IU] | Freq: Three times a day (TID) | SUBCUTANEOUS | Status: DC
  Administered 2019-07-23: 3 [IU] via SUBCUTANEOUS
  Administered 2019-07-24: 5 [IU] via SUBCUTANEOUS
  Administered 2019-07-24: 3 [IU] via SUBCUTANEOUS
  Administered 2019-07-24 – 2019-07-25 (×2): 8 [IU] via SUBCUTANEOUS
  Administered 2019-07-25: 2 [IU] via SUBCUTANEOUS

## 2019-07-23 MED ORDER — SODIUM CHLORIDE 0.9 % IV SOLN
100.0000 mg | Freq: Once | INTRAVENOUS | Status: AC
  Administered 2019-07-23: 100 mg via INTRAVENOUS
  Filled 2019-07-23: qty 20

## 2019-07-23 MED ORDER — SODIUM CHLORIDE 0.9 % IV SOLN
100.0000 mg | Freq: Every day | INTRAVENOUS | Status: DC
  Administered 2019-07-24 – 2019-07-25 (×2): 100 mg via INTRAVENOUS
  Filled 2019-07-23 (×2): qty 20

## 2019-07-23 MED ORDER — IRBESARTAN-HYDROCHLOROTHIAZIDE 150-12.5 MG PO TABS: 1.0000 | ORAL_TABLET | Freq: Every day | ORAL | Status: DC

## 2019-07-23 MED ORDER — NITROGLYCERIN 0.4 MG SL SUBL: 0.4000 mg | SUBLINGUAL_TABLET | SUBLINGUAL | Status: DC | PRN

## 2019-07-23 MED ORDER — DIAZEPAM 5 MG PO TABS
5.0000 mg | ORAL_TABLET | Freq: Four times a day (QID) | ORAL | Status: DC | PRN
  Administered 2019-07-24 – 2019-07-25 (×3): 5 mg via ORAL
  Filled 2019-07-23 (×3): qty 1

## 2019-07-23 MED ORDER — TAMSULOSIN HCL 0.4 MG PO CAPS
0.4000 mg | ORAL_CAPSULE | Freq: Every day | ORAL | Status: DC
  Administered 2019-07-23 – 2019-07-25 (×3): 0.4 mg via ORAL
  Filled 2019-07-23 (×2): qty 1

## 2019-07-23 MED ORDER — DEXAMETHASONE SODIUM PHOSPHATE 10 MG/ML IJ SOLN
6.0000 mg | Freq: Once | INTRAMUSCULAR | Status: AC
  Administered 2019-07-23: 6 mg via INTRAVENOUS
  Filled 2019-07-23: qty 1

## 2019-07-23 MED ORDER — PRAVASTATIN SODIUM 40 MG PO TABS
80.0000 mg | ORAL_TABLET | Freq: Every day | ORAL | Status: DC
  Administered 2019-07-23 – 2019-07-25 (×3): 80 mg via ORAL
  Filled 2019-07-23 (×3): qty 2

## 2019-07-23 MED ORDER — SODIUM CHLORIDE 0.9 % IV SOLN
100.0000 mg | INTRAVENOUS | Status: AC
  Administered 2019-07-23: 100 mg via INTRAVENOUS
  Filled 2019-07-23 (×2): qty 20

## 2019-07-23 MED ORDER — INSULIN ASPART 100 UNIT/ML ~~LOC~~ SOLN
0.0000 [IU] | Freq: Every day | SUBCUTANEOUS | Status: DC
  Administered 2019-07-23: 3 [IU] via SUBCUTANEOUS
  Administered 2019-07-24: 2 [IU] via SUBCUTANEOUS

## 2019-07-23 MED ORDER — POTASSIUM CHLORIDE CRYS ER 20 MEQ PO TBCR
40.0000 meq | EXTENDED_RELEASE_TABLET | Freq: Once | ORAL | Status: AC
  Administered 2019-07-23: 40 meq via ORAL
  Filled 2019-07-23: qty 2

## 2019-07-23 MED ORDER — ALBUTEROL SULFATE HFA 108 (90 BASE) MCG/ACT IN AERS
1.0000 | INHALATION_SPRAY | RESPIRATORY_TRACT | Status: DC | PRN
  Administered 2019-07-23: 2 via RESPIRATORY_TRACT
  Filled 2019-07-23: qty 6.7

## 2019-07-23 MED ORDER — ASPIRIN EC 81 MG PO TBEC
81.0000 mg | DELAYED_RELEASE_TABLET | Freq: Every day | ORAL | Status: DC
  Administered 2019-07-23 – 2019-07-25 (×3): 81 mg via ORAL
  Filled 2019-07-23 (×3): qty 1

## 2019-07-23 MED ORDER — FLUTICASONE PROPIONATE 50 MCG/ACT NA SUSP
2.0000 | Freq: Every day | NASAL | Status: DC
  Administered 2019-07-23 – 2019-07-25 (×3): 2 via NASAL
  Filled 2019-07-23: qty 16

## 2019-07-23 NOTE — Progress Notes (Signed)
CCMD reports pt had burst of SVT up to 175. Sending telemetry strip. Pt denies symptoms, resting comfortably in bed. HR in 90s.  MD notified.

## 2019-07-23 NOTE — ED Notes (Signed)
Pt drinking orange juice 

## 2019-07-23 NOTE — ED Provider Notes (Signed)
[facility] EMERGENCY DEPARTMENT Provider Note   CSN: [id number] Arrival date & time: 07/23/19  5208     History Chief Complaint  Patient presents with  . Fever    Patrick Klein is a 74 y.o. male.  74 yo M with a chief complaint of a persistent fever.  Patient has been having fever cough and myalgias for about 10 days now.  Had a period of vomiting and diarrhea that is resolved.  At 1 point he did a trial of azithromycin which he thinks induced the nausea vomiting and diarrhea.  Denies abdominal pain denies chest pain denies shortness of breath.  He called his physician because he was having continued fevers and so they told him to come to the ED for an infusion.  He is not sure what this meant and did not ask them to clarify.  He did have a positive coronavirus test at the onset of his illness.  This was done at [facility].  The history is provided by the patient.  Fever Associated symptoms: cough, diarrhea, myalgias, nausea and vomiting   Associated symptoms: no chest pain, no chills, no confusion, no congestion, no headaches and no rash   Illness Severity:  Moderate Onset quality:  Gradual Duration:  10 days Timing:  Constant Progression:  Worsening Chronicity:  New Associated symptoms: cough, diarrhea, fever, myalgias, nausea and vomiting   Associated symptoms: no abdominal pain, no chest pain, no congestion, no headaches, no rash and no shortness of breath            Past Medical History:  Diagnosis Date  . Arthritis   . Diabetes mellitus without complication ([facility])   . Diverticulitis   . Hard of hearing   . Hypertension   . MI (mitral incompetence)     Patient Active Problem List   Diagnosis Date Noted  . Acute hypoxemic respiratory failure due to COVID-19 ([facility]) 07/23/2019  . Primary osteoarthritis of both hands 02/04/2018  . Primary osteoarthritis of both knees 02/04/2018  . Primary osteoarthritis of both feet 02/04/2018  . Long  term (current) use of systemic steroids 02/04/2018  . DDD (degenerative disc disease), lumbar 02/04/2018  . Dyslipidemia 02/04/2018  . History of diabetes mellitus 02/04/2018  . Essential hypertension 02/04/2018  . History of right bundle branch block 02/04/2018  . History of diverticulitis 02/04/2018  . History of gastroesophageal reflux (GERD) 02/04/2018  . History of COPD 02/04/2018  . History of vertigo 02/04/2018    Past Surgical History:  Procedure Laterality Date  . CORONARY ANGIOPLASTY WITH STENT PLACEMENT    . HAND SURGERY Right 1986   reconstructive surgery on fingers   . HERNIA REPAIR     x2       Family History  Problem Relation Age of Onset  . Stroke Mother   . Mitral valve prolapse Mother   . Heart attack Father   . Alcoholism Father   . Alcoholism Brother   . Congestive Heart Failure Brother   . Alcoholism Brother   . Congestive Heart Failure Brother   . Anxiety disorder Daughter   . Healthy Daughter     Social History   Tobacco Use  . Smoking status: Former Smoker    Quit date: 07/23/2011    Years since quitting: 8.0  . Smokeless tobacco: Never Used  Substance Use Topics  . Alcohol use: No  . Drug use: No    Home Medications Prior to Admission medications   Medication  Sig Start Date End Date Taking? Authorizing Provider  albuterol (PROVENTIL HFA;VENTOLIN HFA) 108 (90 Base) MCG/ACT inhaler Inhale into the lungs every 6 (six) hours as needed for wheezing or shortness of breath.    [provider]  aspirin EC 81 MG tablet Take 81 mg by mouth daily.    [provider]  diazepam (VALIUM) 5 MG tablet Take 5 mg by mouth every 6 (six) hours as needed for anxiety.    [provider]  Dulaglutide (TRULICITY) 1.5 MG/0.5ML SOPN Inject into the skin.    [provider]  fluticasone (FLONASE) 50 MCG/ACT nasal spray Place 2 sprays into both nostrils daily.    [provider]  irbesartan-hydrochlorothiazide (AVALIDE)  150-12.5 MG tablet Take 1 tablet by mouth daily.    [provider]  [place] Lanius 350 MG CAPS Take by mouth daily.    [provider]  nitroGLYCERIN (NITROSTAT) 0.4 MG SL tablet Place 0.4 mg under the tongue as needed for chest pain.    [provider]  potassium chloride (K-DUR,KLOR-CON) 10 MEQ tablet Take 10 mEq by mouth daily.     [provider]  pravastatin (PRAVACHOL) 80 MG tablet Take 80 mg by mouth daily.    [provider]  predniSONE (DELTASONE) 5 MG tablet Take 5 mg by mouth as needed.    [provider]    Allergies    Ivp dye [iodinated diagnostic agents]  Review of Systems   Review of Systems  Constitutional: Positive for fever. Negative for chills.  HENT: Negative for congestion and facial swelling.   Eyes: Negative for discharge and visual disturbance.  Respiratory: Positive for cough. Negative for shortness of breath.   Cardiovascular: Negative for chest pain and palpitations.  Gastrointestinal: Positive for diarrhea, nausea and vomiting. Negative for abdominal pain.       Nvd have resolved  Musculoskeletal: Positive for myalgias. Negative for arthralgias.  Skin: Negative for color change and rash.  Neurological: Negative for tremors, syncope and headaches.  Psychiatric/Behavioral: Negative for confusion and dysphoric mood.    Physical Exam Updated Vital Signs BP (!) 153/138   Pulse 90   Temp 98.8 F (37.1 C) (Oral)   Resp 20   Ht 5' 10.5" (1.791 m)   Wt 85 kg   SpO2 90%   BMI 26.52 kg/m   Physical Exam Vitals and nursing note reviewed.  Constitutional:      Appearance: He is well-developed.  HENT:     Head: Normocephalic and atraumatic.  Eyes:     Pupils: Pupils are equal, round, and reactive to light.  Neck:     Vascular: No JVD.  Cardiovascular:     Rate and Rhythm: Normal rate and regular rhythm.     Heart sounds: No murmur. No friction rub. No gallop.   Pulmonary:     Effort: No respiratory  distress.     Breath sounds: No wheezing.  Abdominal:     General: There is no distension.     Tenderness: There is no abdominal tenderness. There is no guarding or rebound.  Musculoskeletal:        General: Normal range of motion.     Cervical back: Normal range of motion and neck supple.  Skin:    Coloration: Skin is not pale.     Findings: No rash.  Neurological:     Mental Status: He is alert and oriented to person, place, and time.  Psychiatric:  Behavior: Behavior normal.     ED Results / Procedures / Treatments   Labs (all labs ordered are listed, but only abnormal results are displayed) Labs Reviewed  CBC WITH DIFFERENTIAL/PLATELET - Abnormal; Notable for the following components:      Result Value   WBC 3.9 (*)    MCV 79.4 (*)    Platelets 148 (*)    All other components within normal limits  COMPREHENSIVE METABOLIC PANEL - Abnormal; Notable for the following components:   Sodium 134 (*)    Potassium 2.9 (*)    Glucose, Bld 146 (*)    Calcium 8.4 (*)    Albumin 3.4 (*)    All other components within normal limits  D-DIMER, QUANTITATIVE (NOT AT [facility]) - Abnormal; Notable for the following components:   D-Dimer, Quant 2.10 (*)    All other components within normal limits  LACTATE DEHYDROGENASE - Abnormal; Notable for the following components:   LDH 375 (*)    All other components within normal limits  FIBRINOGEN - Abnormal; Notable for the following components:   Fibrinogen 585 (*)    All other components within normal limits  C-REACTIVE PROTEIN - Abnormal; Notable for the following components:   CRP 6.2 (*)    All other components within normal limits  SARS CORONAVIRUS 2 AG (30 MIN TAT)  CULTURE, BLOOD (ROUTINE X 2)  CULTURE, BLOOD (ROUTINE X 2)  SARS CORONAVIRUS 2 (TAT 6-24 HRS)  LACTIC ACID, PLASMA  PROCALCITONIN  FERRITIN  TRIGLYCERIDES    EKG EKG Interpretation  Date/Time:  Wednesday July 23 2019 09:45:01 EST Ventricular Rate:  81 PR  Interval:    QRS Duration: 127 QT Interval:  393 QTC Calculation: 457 R Axis:   -36 Text Interpretation: Sinus rhythm Ventricular premature complex IVCD, consider atypical RBBB Anterior infarct, old No significant change since last tracing Confirmed by Deno Etienne [phone]) on 07/23/2019 10:20:36 AM   Radiology DG Chest Port 1 View  Result Date: 07/23/2019 CLINICAL DATA:  Fever. COVID-19 positive. EXAM: PORTABLE CHEST 1 VIEW COMPARISON:  None. FINDINGS: Mild cardiomegaly is noted. No pneumothorax or pleural effusion is noted. Mild ill-defined bilateral lung opacities are noted, right greater than left, concerning for multifocal pneumonia. Bony thorax is unremarkable. IMPRESSION: Mild ill-defined bilateral lung opacities are noted, right greater than left, concerning for multifocal pneumonia. Electronically Signed   By: Marijo Conception M.D.   On: 07/23/2019 10:10    Procedures Procedures (including critical care time)  Medications Ordered in ED Medications  remdesivir 100 mg in sodium chloride 0.9 % 100 mL IVPB (100 mg Intravenous New Bag/Given 07/23/19 1323)  remdesivir 100 mg in sodium chloride 0.9 % 100 mL IVPB (has no administration in time range)  dexamethasone (DECADRON) injection 6 mg (6 mg Intravenous Given 07/23/19 1225)    ED Course  I have reviewed the triage vital signs and the nursing notes.  Pertinent labs & imaging results that were available during my care of the patient were reviewed by me and considered in my medical decision making (see chart for details).    MDM Rules/Calculators/A&P                      74 yo M with a chief complaints of persistent fevers.  This is about 10 days out after the onset of an illness that he had a positive test for the novel coronavirus.  Patient is well-appearing and nontoxic on my exam.  He was pleasantly hypoxic  on arrival with a resting O2 sat of 87.  Placed on oxygen therapy.  Likely the patient needs admission as he is hypoxic at  rest.  Will put in the order set for likely admission.  I am unable to access the [facility] test results from [facility] will send a point-of-care here.  Able to get result from [facility].  Admit.   The patients results and plan were reviewed and discussed.   Any x-rays performed were independently reviewed by myself.   Differential diagnosis were considered with the presenting HPI.  Medications  remdesivir 100 mg in sodium chloride 0.9 % 100 mL IVPB (100 mg Intravenous New Bag/Given 07/23/19 1323)  remdesivir 100 mg in sodium chloride 0.9 % 100 mL IVPB (has no administration in time range)  dexamethasone (DECADRON) injection 6 mg (6 mg Intravenous Given 07/23/19 1225)    Vitals:   07/23/19 1300 07/23/19 1300 07/23/19 1324 07/23/19 1330  BP: (!) 141/75  (!) 141/75 (!) 153/138  Pulse: 80  82 90  Resp: (!) 24  (!) 28 20  Temp:  98.8 F (37.1 C)    TempSrc:  Oral    SpO2: 95%  95% 90%  Weight:      Height:        Final diagnoses:  COVID-19 virus infection    Admission/ observation were discussed with the admitting physician, patient and/or family and they are comfortable with the plan.   Final Clinical Impression(s) / ED Diagnoses Final diagnoses:  COVID-19 virus infection    Rx / DC Orders ED Discharge Orders    None       Melene Plan, DO 07/23/19 1435

## 2019-07-23 NOTE — ED Notes (Signed)
ED Provider at bedside. 

## 2019-07-23 NOTE — H&P (Signed)
History and Physical    Jeremiyah Cullens IOX:[id number] DOB: 1945/10/24 DOA: 07/23/2019  PCP: [facility]  Patient coming from: [facility]  Chief Complaint: Shortness of breath  HPI: Patrick Klein is a 74 y.o. male with medical history significant of non-insulin-dependent diabetes type 2, diverticulosis, hypertension essential, COPD without requirement of oxygen, unspecified arthritis.  Patient presents to the ED at [facility] worsening fever over 10 days.  [facility] at [facility] care and was Covid positive on 07/13/2019. Patient also had episodes of intractable nausea vomiting and diarrhea at home but this is since resolved.  Patient did see PCP and was placed on azithromycin for suspected community-acquired pneumonia with worsening shortness of breath and pleuritic chest pain.  Been worsening fevers and cough as well as shortness of breath patient was evaluated by ED found to be moderately hypoxic with bibasilar opacifications concerning for infection.  Patient subsequently sent to [facility] given Covid positive, hypoxic and requiring hospitalization.  Review of Systems: As per HPI otherwise 10 point review of systems negative.   Past Medical History:  Diagnosis Date  . Arthritis   . Diabetes mellitus without complication (HCC)   . Diverticulitis   . Hard of hearing   . Hypertension   . MI (mitral incompetence)     Past Surgical History:  Procedure Laterality Date  . CORONARY ANGIOPLASTY WITH STENT PLACEMENT    . HAND SURGERY Right 1986   reconstructive surgery on fingers   . HERNIA REPAIR     x2     reports that he quit smoking about 8 years ago. He has never used smokeless tobacco. He reports that he does not drink alcohol or use drugs.  Allergies  Allergen Reactions  . Ivp Dye [Iodinated Diagnostic Agents]     Shock     Family History  Problem Relation Age of Onset  . Stroke Mother   . Mitral valve prolapse Mother    . Heart attack Father   . Alcoholism Father   . Alcoholism Brother   . Congestive Heart Failure Brother   . Alcoholism Brother   . Congestive Heart Failure Brother   . Anxiety disorder Daughter   . Healthy Daughter     Prior to Admission medications   Medication Sig Start Date End Date Taking? Authorizing Provider  albuterol (PROVENTIL HFA;VENTOLIN HFA) 108 (90 Base) MCG/ACT inhaler Inhale into the lungs every 6 (six) hours as needed for wheezing or shortness of breath.    [provider]  aspirin EC 81 MG tablet Take 81 mg by mouth daily.    [provider]  diazepam (VALIUM) 5 MG tablet Take 5 mg by mouth every 6 (six) hours as needed for anxiety.    [provider]  Dulaglutide (TRULICITY) 1.5 MG/0.5ML SOPN Inject into the skin.    [provider]  fluticasone (FLONASE) 50 MCG/ACT nasal spray Place 2 sprays into both nostrils daily.    [provider]  irbesartan-hydrochlorothiazide (AVALIDE) 150-12.5 MG tablet Take 1 tablet by mouth daily.    [provider]  [place] Lanius 350 MG CAPS Take by mouth daily.    [provider]  nitroGLYCERIN (NITROSTAT) 0.4 MG SL tablet Place 0.4 mg under the tongue as needed for chest pain.    [provider]  potassium chloride (K-DUR,KLOR-CON) 10 MEQ tablet Take 10 mEq by mouth daily.     [provider]  pravastatin (PRAVACHOL) 80 MG tablet Take 80  mg by mouth daily.    [provider]  predniSONE (DELTASONE) 5 MG tablet Take 5 mg by mouth as needed.    [provider]    Physical Exam: Vitals:   07/23/19 1300 07/23/19 1324 07/23/19 1330 07/23/19 1447  BP:  (!) 141/75 (!) 153/138 (!) 146/87  Pulse:  82 90 80  Resp:  (!) 28 20   Temp: 98.8 F (37.1 C)   98.7 F (37.1 C)  TempSrc: Oral   Oral  SpO2:  95% 90% 97%  Weight:      Height:        Constitutional: NAD, calm, comfortable Vitals:   07/23/19 1300 07/23/19 1324 07/23/19 1330 07/23/19 1447   BP:  (!) 141/75 (!) 153/138 (!) 146/87  Pulse:  82 90 80  Resp:  (!) 28 20   Temp: 98.8 F (37.1 C)   98.7 F (37.1 C)  TempSrc: Oral   Oral  SpO2:  95% 90% 97%  Weight:      Height:       General:  Pleasantly resting in bed, No acute distress. HEENT:  Normocephalic atraumatic.  Sclerae nonicteric, noninjected.  Extraocular movements intact bilaterally. Neck:  Without mass or deformity.  Trachea is midline. Lungs:  Clear to auscultate bilaterally without rhonchi, wheeze, or rales. Heart:  Regular rate and rhythm.  Without murmurs, rubs, or gallops. Abdomen:  Soft, nontender, nondistended.  Without guarding or rebound. Extremities: Without cyanosis, clubbing, edema, or obvious deformity. Vascular:  Dorsalis pedis and posterior tibial pulses palpable bilaterally. Skin:  Warm and dry, no erythema, no ulcerations.   Labs on Admission: I have personally reviewed following labs and imaging studies  CBC: Recent Labs  Lab 07/23/19 0948  WBC 3.9*  NEUTROABS 2.9  HGB 13.6  HCT 40.0  MCV 79.4*  PLT 703*   Basic Metabolic Panel: Recent Labs  Lab 07/23/19 0948  NA 134*  K 2.9*  CL 99  CO2 27  GLUCOSE 146*  BUN 13  CREATININE 0.67  CALCIUM 8.4*   GFR: Estimated Creatinine Clearance: 83.7 mL/min (by C-G formula based on SCr of 0.67 mg/dL).   Liver Function Tests: Recent Labs  Lab 07/23/19 0948  AST 30  ALT 19  ALKPHOS 51  BILITOT 0.8  PROT 6.8  ALBUMIN 3.4*   HbA1C: No results for input(s): HGBA1C in the last 72 hours. CBG: No results for input(s): GLUCAP in the last 168 hours. Lipid Profile: Recent Labs    07/23/19 0948  TRIG 69   Anemia Panel: Recent Labs    07/23/19 0948  FERRITIN 174   Urine analysis:    Component Value Date/Time   COLORURINE YELLOW 01/17/2018 1013   APPEARANCEUR CLEAR 01/17/2018 1013   LABSPEC 1.013 01/17/2018 1013   PHURINE 6.5 01/17/2018 1013   GLUCOSEU NEGATIVE 01/17/2018 1013   HGBUR NEGATIVE 01/17/2018 1013    KETONESUR NEGATIVE 01/17/2018 1013   PROTEINUR NEGATIVE 01/17/2018 1013   NITRITE NEGATIVE 01/17/2018 1013   LEUKOCYTESUR NEGATIVE 01/17/2018 1013    Radiological Exams on Admission: DG Chest Port 1 View  Result Date: 07/23/2019 CLINICAL DATA:  Fever. COVID-19 positive. EXAM: PORTABLE CHEST 1 VIEW COMPARISON:  None. FINDINGS: Mild cardiomegaly is noted. No pneumothorax or pleural effusion is noted. Mild ill-defined bilateral lung opacities are noted, right greater than left, concerning for multifocal pneumonia. Bony thorax is unremarkable. IMPRESSION: Mild ill-defined bilateral lung opacities are noted, right greater than left, concerning for multifocal pneumonia. Electronically Signed   By: Jeneen Rinks  Christen Butter M.D.   On: 07/23/2019 10:10   Assessment/Plan Active Problems:   Acute hypoxemic respiratory failure due to COVID-19 [facility])   Acute hypoxic respiratory failure in the setting of COVID-19 pneumonia, POA SpO2: 97 % O2 Flow Rate (L/min): 2 L/min Recent Labs    07/23/19 0948  DDIMER 2.10*  FERRITIN 174  LDH 375*  CRP 6.2*  -Patient meets mild to moderate disease given hypoxia, symptoms and prolonged course now for at least 11 days -Continue remdesivir, dexamethasone for 5 and 10 days respectively; pending clinical status may be candidate for outpatient completion of remdesivir -Hold Actemra given minimally elevated CRP, stable symptoms and mild hypoxia. -Continue supportive care with incentive spirometry, flutter, proning, early ambulation, off suppressants, albuterol  Non-insulin-dependent diabetes type 2 -Reportedly well controlled on Trulicity and diet monitoring -Continue sliding scale protocol while on steroids expect patient's glucose to be somewhat poorly controlled  Hypertension, essential -Resume home medications, HCTZ, irbesartan  COPD, not in acute exacerbation -Continue albuterol supportive care as above, currently without wheeze  Hyperlipidemia - Continue home  pravastatin  DVT prophylaxis: Lovenox Code Status: Full Family Communication: Updated by patient Disposition Plan: Inpatient, pending clinical status likely discharge back home Consults called: None Admission status: Patient, continues to require IV medications, close monitoring in the setting of COVID-19 pneumonia with moderate disease with high risk for decompensation.  Azucena Fallen DO [facility]  If 7PM-7AM, please contact night-coverage [email or web address] 07/23/2019, 3:25 PM

## 2019-07-23 NOTE — ED Triage Notes (Signed)
Pt tested positive for Covid 10 days ago.  Has been having low grade fever ever since.  Pt sts his pmd instructed him to come in for "IV Therapy".

## 2019-07-23 NOTE — ED Notes (Signed)
Pt is calling his wife at home to update her on his situation and give her the address of [facility].

## 2019-07-24 LAB — COMPREHENSIVE METABOLIC PANEL
ALT: 23 U/L (ref 0–44)
AST: 29 U/L (ref 15–41)
Albumin: 3.2 g/dL — ABNORMAL LOW (ref 3.5–5.0)
Alkaline Phosphatase: 51 U/L (ref 38–126)
Anion gap: 9 (ref 5–15)
BUN: 23 mg/dL (ref 8–23)
CO2: 28 mmol/L (ref 22–32)
Calcium: 8.7 mg/dL — ABNORMAL LOW (ref 8.9–10.3)
Chloride: 103 mmol/L (ref 98–111)
Creatinine, Ser: 0.69 mg/dL (ref 0.61–1.24)
GFR calc Af Amer: >60 mL/min (ref >60)
GFR calc non Af Amer: >60 mL/min (ref >60)
Glucose, Bld: 160 mg/dL — ABNORMAL HIGH (ref 70–99)
Potassium: 3.7 mmol/L (ref 3.5–5.1)
Sodium: 140 mmol/L (ref 135–145)
Total Bilirubin: 0.5 mg/dL (ref 0.3–1.2)
Total Protein: 6.8 g/dL (ref 6.5–8.1)

## 2019-07-24 LAB — CBC
HCT: 39.6 % (ref 39.0–52.0)
Hemoglobin: 13.1 g/dL (ref 13.0–17.0)
MCH: 26.8 pg (ref 26.0–34.0)
MCHC: 33.1 g/dL (ref 30.0–36.0)
MCV: 81 fL (ref 80.0–100.0)
Platelets: 164 10*3/uL (ref 150–400)
RBC: 4.89 MIL/uL (ref 4.22–5.81)
RDW: 14.8 % (ref 11.5–15.5)
WBC: 2.9 10*3/uL — ABNORMAL LOW (ref 4.0–10.5)
nRBC: 0 % (ref 0.0–0.2)

## 2019-07-24 LAB — HEMOGLOBIN A1C
Hgb A1c MFr Bld: 5.7 % — ABNORMAL HIGH (ref 4.8–5.6)
Mean Plasma Glucose: 116.89 mg/dL

## 2019-07-24 LAB — GLUCOSE, CAPILLARY
Glucose-Capillary: 157 mg/dL — ABNORMAL HIGH (ref 70–99)
Glucose-Capillary: 265 mg/dL — ABNORMAL HIGH (ref 70–99)
Glucose-Capillary: 237 mg/dL — ABNORMAL HIGH (ref 70–99)

## 2019-07-24 LAB — C-REACTIVE PROTEIN: CRP: 7.8 mg/dL — ABNORMAL HIGH (ref <1.0)

## 2019-07-24 LAB — D-DIMER, QUANTITATIVE: D-Dimer, Quant: 1.23 ug/mL-FEU — ABNORMAL HIGH (ref 0.00–0.50)

## 2019-07-24 NOTE — Evaluation (Addendum)
Physical Therapy Evaluation Patient Details Name: Patrick Klein MRN: [id number] DOB: 11/05/1945 Today's Date: 07/24/2019   History of Present Illness  74 y.o. male with medical history significant of non-insulin-dependent diabetes type 2, diverticulosis, hypertension essential, COPD without requirement of oxygen, unspecified arthritis.  Patient presents to the ED at [facility] worsening fever over 10 days.  [facility] and was Covid positive on 07/13/2019. Patient subsequently sent to [facility] given Covid positive, hypoxic and requiring hospitalization.  Clinical Impression   Pt admitted with above dx and above hx. He reports at home he was independent and able to get everything down on his own. He lived home with spouse and children, but was ambulating with cane d/t arthritis in R knee. This pm during assessment pt did fairly well functionally was able to get around room and ambulate in hall with stand by assist but he did desat with activity and seemed to have poor insight into these deficits. At rest pt on room air and sats in low 90s, with ambulation on room air immediately desatto 87% within 75ft, ambulated further and desat to 84% pt placed on 2l/min and aasked to complete pursed lip breathing, able to increase sats to low 90s with ambulation resumption desat again to 86% pt increased to 3L and asked to ocmplete pursed lip breathing able to recover to low 90s, with ambulation desat to min 88% briefly. After seated rest attempt ambulation again, used different monitor and this time able to ambulate approx 75ft with SBA and sats in low 90s on 3L/min.Pt was educated on pursed lip breathing but he complained that when he does complete this his throat hurts and it makes him cough. Pt would benefit from continue PT tx while in hospital to address activity tolerance,independence and safety with functional mobility. At d/c he should be able to return  home with no further f/u.     Follow Up Recommendations No PT follow up    Equipment Recommendations  None recommended by PT    Recommendations for Other Services       Precautions / Restrictions Precautions Precautions: Fall Precaution Comments: desat w/ poor pursed lip breathing Restrictions Weight Bearing Restrictions: No      Mobility  Bed Mobility               General bed mobility comments: pt sitting in recliner at therapist arrival  Transfers Overall transfer level: Needs assistance Equipment used: Straight cane Transfers: Stand Pivot Transfers;Sit to/from Stand Sit to Stand: Supervision Stand pivot transfers: Supervision       General transfer comment: line management and stand by assist  Ambulation/Gait Ambulation/Gait assistance: Supervision Gait Distance (Feet): 175 Feet Assistive device: Straight cane Gait Pattern/deviations: Step-through pattern Gait velocity: fair   General Gait Details: at rest pt on room air and sats in low 90s, with ambulation on room air immediately desatto 87% within 75ft, ambulated further and desat to 84% pt placed on 2l/min and aasked to complete pursed lip breathing, able to increase sats to low 90s with ambulation resumption desat again to 86% pt increased to 3L and asked to ocmplete pursed lip breathing able to recover to low 90s, with ambulation desat to min 88% briefly. After seated rest attempt ambulation again, used different monitor and this time able to ambulate approx 75ft with SBA and sats in low 90s on 3L/min.   Stairs  Wheelchair Mobility    Modified Rankin (Stroke Patients Only)       Balance Overall balance assessment: Mild deficits observed, not formally tested                                           Pertinent Vitals/Pain Pain Assessment: Faces Faces Pain Scale: Hurts even more Pain Location: in throat with pursed lip breathing, maybe some pain in R knee  also Pain Descriptors / Indicators: Aching;Grimacing;Discomfort Pain Intervention(s): Limited activity within patient's tolerance    Home Living Family/patient expects to be discharged to:: Private residence Living Arrangements: Spouse/significant other;Children Available Help at Discharge: Family Type of Home: House Home Access: Stairs to enter   Secretary/administrator of Steps: 1 Home Layout: Two level Home Equipment: Grab bars - tub/shower;Cane - single point      Prior Function Level of Independence: Independent with assistive device(s)               Hand Dominance   Dominant Hand: Right    Extremity/Trunk Assessment        Lower Extremity Assessment Lower Extremity Assessment: Overall WFL for tasks assessed       Communication   Communication: HOH  Cognition Arousal/Alertness: Awake/alert Behavior During Therapy: WFL for tasks assessed/performed Overall Cognitive Status: Within Functional Limits for tasks assessed                                 General Comments: just HOH otherwise functional      General Comments      Exercises     Assessment/Plan    PT Assessment Patient needs continued PT services  PT Problem List Decreased activity tolerance;Decreased mobility;Decreased safety awareness       PT Treatment Interventions Gait training;Functional mobility training;Therapeutic activities;Therapeutic exercise    PT Goals (Current goals can be found in the Care Plan section)  Acute Rehab PT Goals Patient Stated Goal: to go home today PT Goal Formulation: With patient Time For Goal Achievement: 08/07/19 Potential to Achieve Goals: Good    Frequency Min 3X/week   Barriers to discharge        Co-evaluation               AM-PAC PT "6 Clicks" Mobility  Outcome Measure Help needed turning from your back to your side while in a flat bed without using bedrails?: None Help needed moving from lying on your back to sitting  on the side of a flat bed without using bedrails?: None Help needed moving to and from a bed to a chair (including a wheelchair)?: A Little Help needed standing up from a chair using your arms (e.g., wheelchair or bedside chair)?: A Little Help needed to walk in hospital room?: A Little Help needed climbing 3-5 steps with a railing? : A Little 6 Click Score: 20    End of Session Equipment Utilized During Treatment: Oxygen Activity Tolerance: Treatment limited secondary to medical complications (Comment) Patient left: in chair;with call bell/phone within reach Nurse Communication: Mobility status PT Visit Diagnosis: Other abnormalities of gait and mobility (R26.89)    Time: 4098-1191 PT Time Calculation (min) (ACUTE ONLY): 16 min   Charges:   PT Evaluation $PT Eval Moderate Complexity: 1 Mod          Drema Pry, PT  Freddi Starr 07/24/2019, 1:17 PM

## 2019-07-24 NOTE — Progress Notes (Signed)
Progress Note  [name] ZOX:[id number] DOB: [birth year] DOA: 07/23/2019  PCP: [facility]  Patient coming from: [facility]  Chief Complaint: Shortness of breath  HPI: Patrick Klein is a 74 y.o. male with medical history significant of non-insulin-dependent diabetes type 2, diverticulosis, hypertension essential, COPD without requirement of oxygen, unspecified arthritis.  Patient presents to the ED at [facility] worsening fever over 10 days.  [facility] and was Covid positive on 07/13/2019. Patient also had episodes of intractable nausea vomiting and diarrhea at home but this is since resolved.  Patient did see PCP and was placed on azithromycin for suspected community-acquired pneumonia with worsening shortness of breath and pleuritic chest pain.  Been worsening fevers and cough as well as shortness of breath patient was evaluated by ED found to be moderately hypoxic with bibasilar opacifications concerning for infection.  Patient subsequently sent to [facility] given Covid positive, hypoxic and requiring hospitalization.  Subjective: No acute issues or events overnight, feeling somewhat improved; denies headache, fevers, chills, nausea, vomiting, diarrhea, constipation.  Assessment/Plan  Acute hypoxic respiratory failure in the setting of COVID-19 pneumonia, POA SpO2: 98 % O2 Flow Rate (L/min): 2 L/min Recent Labs    07/23/19 0948 07/24/19 0200  DDIMER 2.10* 1.23*  FERRITIN 174  --   LDH 375*  --   CRP 6.2* 7.8*   Lab Results  Component Value Date   SARSCOV2NAA POSITIVE (A) 07/23/2019   Patient meets mild to moderate disease given hypoxia, symptoms and prolonged course now for at least 11 days - Continue remdesivir, dexamethasone for 5 and 10 days respectively; pending clinical status may be candidate for outpatient completion of remdesivir -Moderately hypoxic and symptomatic with ambulation today, desat into  the low 80s on room air, high 80s on 2 L nasal cannula - Hold Actemra given minimally elevated CRP, stable symptoms and mild hypoxia. - Continue supportive care with incentive spirometry, flutter, proning, early ambulation, off suppressants, albuterol  Non-insulin-dependent diabetes type 2 - Reportedly well controlled on Trulicity and diet monitoring - Continue sliding scale protocol while on steroids as we expect patient's glucose to be somewhat more elevated than baseline - A1C 5.7   Hypertension, essential - Resume home medications, HCTZ, irbesartan  COPD, not in acute exacerbation - Continue albuterol supportive care as above, currently without wheeze  Hyperlipidemia - Continue home pravastatin  DVT prophylaxis: Lovenox Code Status: Full Family Communication: Updated by patient Disposition Plan: Inpatient, pending clinical status likely discharge back home Consults called: None Admission status: Patient, continues to require IV medications, close monitoring in the setting of COVID-19 pneumonia with moderate disease with high risk for decompensation.   Physical Exam: Vitals:   07/24/19 0730 07/24/19 0800 07/24/19 1122 07/24/19 1212  BP: 117/73 115/76 121/80 111/73  Pulse:  80 (!) 50   Resp: 16 16 20 20   Temp: 97.8 F (36.6 C)  97.9 F (36.6 C) 98.2 F (36.8 C)  TempSrc: Oral  Oral Oral  SpO2:  98% (!) 85% 98%  Weight:      Height:       Constitutional: NAD, calm, comfortable Vitals:   07/24/19 0730 07/24/19 0800 07/24/19 1122 07/24/19 1212  BP: 117/73 115/76 121/80 111/73  Pulse:  80 (!) 50   Resp: 16 16 20 20   Temp: 97.8 F (36.6 C)  97.9 F (36.6 C) 98.2 F (36.8 C)  TempSrc: Oral  Oral Oral  SpO2:  98% (!) 85% 98%  Weight:      Height:       General:  Pleasantly resting in bed, No acute distress. HEENT:  Normocephalic atraumatic.  Sclerae nonicteric, noninjected.  Extraocular movements intact bilaterally. Neck:  Without mass or deformity.  Trachea is  midline. Lungs:  Clear to auscultate bilaterally without rhonchi, wheeze, or rales. Heart:  Regular rate and rhythm.  Without murmurs, rubs, or gallops. Abdomen:  Soft, nontender, nondistended.  Without guarding or rebound. Extremities: Without cyanosis, clubbing, edema, or obvious deformity. Vascular:  Dorsalis pedis and posterior tibial pulses palpable bilaterally. Skin:  Warm and dry, no erythema, no ulcerations.  Labs on Admission: I have personally reviewed following labs and imaging studies  CBC: Recent Labs  Lab 07/23/19 0948 07/24/19 0200  WBC 3.9* 2.9*  NEUTROABS 2.9  --   HGB 13.6 13.1  HCT 40.0 39.6  MCV 79.4* 81.0  PLT 148* 164   Basic Metabolic Panel: Recent Labs  Lab 07/23/19 0948 07/24/19 0200  NA 134* 140  K 2.9* 3.7  CL 99 103  CO2 27 28  GLUCOSE 146* 160*  BUN 13 23  CREATININE 0.67 0.69  CALCIUM 8.4* 8.7*   GFR: Estimated Creatinine Clearance: 83.7 mL/min (by C-G formula based on SCr of 0.69 mg/dL).   Liver Function Tests: Recent Labs  Lab 07/23/19 0948 07/24/19 0200  AST 30 29  ALT 19 23  ALKPHOS 51 51  BILITOT 0.8 0.5  PROT 6.8 6.8  ALBUMIN 3.4* 3.2*   HbA1C: Recent Labs    07/24/19 0200  HGBA1C 5.7*   CBG: Recent Labs  Lab 07/23/19 1720 07/23/19 2211 07/24/19 0736 07/24/19 1121  GLUCAP 180* 273* 157* 265*   Lipid Profile: Recent Labs    07/23/19 0948  TRIG 69   Anemia Panel: Recent Labs    07/23/19 0948  FERRITIN 174   Urine analysis:    Component Value Date/Time   COLORURINE YELLOW 01/17/2018 1013   APPEARANCEUR CLEAR 01/17/2018 1013   LABSPEC 1.013 01/17/2018 1013   PHURINE 6.5 01/17/2018 1013   GLUCOSEU NEGATIVE 01/17/2018 1013   HGBUR NEGATIVE 01/17/2018 1013   KETONESUR NEGATIVE 01/17/2018 1013   PROTEINUR NEGATIVE 01/17/2018 1013   NITRITE NEGATIVE 01/17/2018 1013   LEUKOCYTESUR NEGATIVE 01/17/2018 1013   Radiological Exams on Admission: DG Chest Port 1 View  Result Date: 07/23/2019 CLINICAL  DATA:  Fever. COVID-19 positive. EXAM: PORTABLE CHEST 1 VIEW COMPARISON:  None. FINDINGS: Mild cardiomegaly is noted. No pneumothorax or pleural effusion is noted. Mild ill-defined bilateral lung opacities are noted, right greater than left, concerning for multifocal pneumonia. Bony thorax is unremarkable. IMPRESSION: Mild ill-defined bilateral lung opacities are noted, right greater than left, concerning for multifocal pneumonia. Electronically Signed   By: Lupita Raider M.D.   On: 07/23/2019 10:10    Azucena Fallen DO [facility]  If 7PM-7AM, please contact night-coverage [email or web address] 07/24/2019, 3:25 PM

## 2019-07-24 NOTE — Progress Notes (Signed)
Occupational Therapy Evaluation Patient Details Name: Patrick Klein MRN: [id number] DOB: [birth year] Today's Date: 07/24/2019    History of Present Illness 74 y.o. male with medical history significant of non-insulin-dependent diabetes type 2, diverticulosis, hypertension essential, COPD without requirement of oxygen, unspecified arthritis.  Patient presents to the ED at [facility] worsening fever over 10 days.  Patient reported to [facility] for further evaluation at an urgent care and was Covid positive on 07/13/2019. Patient subsequently sent to [facility] given Covid positive, hypoxic and requiring hospitalization.   Clinical Impression   Patient is kind.  He lives with family, him and wife are on 2nd floor of home.  He was independent prior and walks with single point can due to R knee arthritis.  Today patient did well with mobility and standing ADLs.  He was able to walk and complete ADLs with supervision.  Therapist had continuous trouble getting accurate read on SpO2 after attempting multiple monitors/probes/cords, but appears patient was around low 90s at rest on room air and mid 80s with mobility on room air.  With 2L SpO2 increased to 79K, but uncertain if this is accurate.  He did display shortness of breath after walking 60 ft, but denied he felt winded indicating poor insight.  Patient would benefit from OT to work on increasing activity tolerance and safety awareness for safe discharge home.  Will continue to follow with OT acutely to address the deficits listed below.      Follow Up Recommendations  No OT follow up    Equipment Recommendations  3 in 1 bedside commode    Recommendations for Other Services       Precautions / Restrictions Precautions Precautions: Fall Precaution Comments: desat w/ poor pursed lip breathing Restrictions Weight Bearing Restrictions: No      Mobility Bed Mobility               General bed mobility comments: pt sitting in recliner at  therapist arrival  Transfers Overall transfer level: Needs assistance Equipment used: Straight cane Transfers: Stand Pivot Transfers;Sit to/from Stand Sit to Stand: Supervision Stand pivot transfers: Supervision           Balance Overall balance assessment: Mild deficits observed, not formally tested                                         ADL either performed or assessed with clinical judgement   ADL Overall ADL's : Needs assistance/impaired Eating/Feeding: Independent;Sitting   Grooming: Wash/dry hands;Wash/dry face;Oral care;Supervision/safety;Standing   Upper Body Bathing: Supervision/ safety;Standing   Lower Body Bathing: Supervison/ safety;Sit to/from stand   Upper Body Dressing : Set up;Sitting   Lower Body Dressing: Supervision/safety;Sit to/from stand   Toilet Transfer: Supervision/safety;Ambulation   Toileting- Clothing Manipulation and Hygiene: Supervision/safety;Sit to/from stand       Functional mobility during ADLs: Supervision/safety;Cane       Vision         Perception     Praxis      Pertinent Vitals/Pain Pain Assessment: Faces Faces Pain Scale: Hurts little more Pain Location: Hurts with pursed lip breathing Pain Descriptors / Indicators: Aching;Grimacing;Discomfort Pain Intervention(s): Limited activity within patient's tolerance;Monitored during session     Hand Dominance Right   Extremity/Trunk Assessment Upper Extremity Assessment Upper Extremity Assessment: Generalized weakness          Communication Communication Communication: HOH   Cognition  Arousal/Alertness: Awake/alert Behavior During Therapy: WFL for tasks assessed/performed Overall Cognitive Status: Within Functional Limits for tasks assessed                                 General Comments: just HOH otherwise functional   General Comments       Exercises     Shoulder Instructions      Home Living Family/patient expects  to be discharged to:: Private residence Living Arrangements: Spouse/significant other;Children Available Help at Discharge: Family Type of Home: House Home Access: Stairs to enter Secretary/administrator of Steps: 1   Home Layout: Two level Alternate Level Stairs-Number of Steps: 14 Alternate Level Stairs-Rails: Can reach both Bathroom Shower/Tub: Tub/shower unit         Home Equipment: Grab bars - tub/shower;Cane - single point   Additional Comments: Lives on second floor      Prior Functioning/Environment Level of Independence: Independent with assistive device(s)                 OT Problem List: Decreased strength;Decreased activity tolerance;Impaired balance (sitting and/or standing);Cardiopulmonary status limiting activity      OT Treatment/Interventions: Self-care/ADL training;Therapeutic exercise;Energy conservation;Therapeutic activities;Balance training;Patient/family education    OT Goals(Current goals can be found in the care plan section) Acute Rehab OT Goals Patient Stated Goal: to go home today OT Goal Formulation: With patient Time For Goal Achievement: 08/07/19 Potential to Achieve Goals: Good  OT Frequency: Min 3X/week   Barriers to D/C:            Co-evaluation              AM-PAC OT "6 Clicks" Daily Activity     Outcome Measure Help from another person eating meals?: None Help from another person taking care of personal grooming?: A Little Help from another person toileting, which includes using toliet, bedpan, or urinal?: A Little Help from another person bathing (including washing, rinsing, drying)?: A Little Help from another person to put on and taking off regular upper body clothing?: A Little Help from another person to put on and taking off regular lower body clothing?: A Little 6 Click Score: 19   End of Session Equipment Utilized During Treatment: Oxygen;Other (comment)(cane) Nurse Communication: Mobility status  Activity  Tolerance: Patient tolerated treatment well Patient left: in chair;with call bell/phone within reach  OT Visit Diagnosis: Unsteadiness on feet (R26.81);Muscle weakness (generalized) (M62.81)                Time: 3818-2993 OT Time Calculation (min): 58 min Charges:  OT General Charges $OT Visit: 1 Visit OT Evaluation $OT Eval Moderate Complexity: 1 Mod OT Treatments $Self Care/Home Management : 23-37 mins $Therapeutic Activity: 8-22 mins  Barbie Banner, OTR/L   Adella Hare 07/24/2019, 1:40 PM

## 2019-07-24 NOTE — Plan of Care (Signed)
SATURATION QUALIFICATIONS: (This note is used to comply with regulatory documentation for home oxygen)  Patient Saturations on Room Air at Rest = 94%  Patient Saturations on Room Air while Ambulating = 84%  Patient Saturations on 3 Liters of oxygen while Ambulating = 88-90%  Please briefly explain why patient needs home oxygen:  Pt desats < 84% on room air while ambulating needs oxygen for activity.

## 2019-07-25 LAB — COMPREHENSIVE METABOLIC PANEL
ALT: 29 U/L (ref 0–44)
AST: 30 U/L (ref 15–41)
Albumin: 3 g/dL — ABNORMAL LOW (ref 3.5–5.0)
Alkaline Phosphatase: 52 U/L (ref 38–126)
Anion gap: 8 (ref 5–15)
BUN: 28 mg/dL — ABNORMAL HIGH (ref 8–23)
CO2: 28 mmol/L (ref 22–32)
Calcium: 8.6 mg/dL — ABNORMAL LOW (ref 8.9–10.3)
Chloride: 105 mmol/L (ref 98–111)
Creatinine, Ser: 0.61 mg/dL (ref 0.61–1.24)
GFR calc Af Amer: >60 mL/min (ref >60)
GFR calc non Af Amer: >60 mL/min (ref >60)
Glucose, Bld: 148 mg/dL — ABNORMAL HIGH (ref 70–99)
Potassium: 3.6 mmol/L (ref 3.5–5.1)
Sodium: 141 mmol/L (ref 135–145)
Total Bilirubin: 0.6 mg/dL (ref 0.3–1.2)
Total Protein: 6.3 g/dL — ABNORMAL LOW (ref 6.5–8.1)

## 2019-07-25 LAB — CBC
HCT: 37 % — ABNORMAL LOW (ref 39.0–52.0)
Hemoglobin: 12.4 g/dL — ABNORMAL LOW (ref 13.0–17.0)
MCH: 26.8 pg (ref 26.0–34.0)
MCHC: 33.5 g/dL (ref 30.0–36.0)
MCV: 79.9 fL — ABNORMAL LOW (ref 80.0–100.0)
Platelets: 193 10*3/uL (ref 150–400)
RBC: 4.63 MIL/uL (ref 4.22–5.81)
RDW: 14.9 % (ref 11.5–15.5)
WBC: 7 10*3/uL (ref 4.0–10.5)
nRBC: 0 % (ref 0.0–0.2)

## 2019-07-25 LAB — D-DIMER, QUANTITATIVE: D-Dimer, Quant: 1.12 ug/mL-FEU — ABNORMAL HIGH (ref 0.00–0.50)

## 2019-07-25 LAB — C-REACTIVE PROTEIN: CRP: 3.7 mg/dL — ABNORMAL HIGH (ref <1.0)

## 2019-07-25 MED ORDER — PREDNISONE 10 MG PO TABS: ORAL_TABLET | ORAL | 0 refills | Status: AC

## 2019-07-25 NOTE — Progress Notes (Signed)
Patient scheduled for outpatient Remdesivir infusion at 8:30 AM on Saturday 2/27.  Please advise them to report to [facility] at [address]  Drive to the security guard and tell them you are here for an infusion. They will direct you to the front entrance where we will come and get you.  For questions call [phone].  Thanks

## 2019-07-25 NOTE — Discharge Instructions (Addendum)
You are scheduled for an outpatient infusion of Remdesivir at 8:30 AM on Saturday 2/27.  Please report to [facility] at [address].  Drive to the security guard and tell them you are here for an infusion. They will direct you to the front entrance where we will come and get you.  For questions call [phone].  Thanks   COVID-19 COVID-19 is a respiratory infection that is caused by a virus called severe acute respiratory syndrome coronavirus 2 (SARS-CoV-2). The disease is also known as coronavirus disease or novel coronavirus. In some people, the virus may not cause any symptoms. In others, it may cause a serious infection. The infection can get worse quickly and can lead to complications, such as:  Pneumonia, or infection of the lungs.  Acute respiratory distress syndrome or ARDS. This is a condition in which fluid build-up in the lungs prevents the lungs from filling with air and passing oxygen into the blood.  Acute respiratory failure. This is a condition in which there is not enough oxygen passing from the lungs to the body or when carbon dioxide is not passing from the lungs out of the body.  Sepsis or septic shock. This is a serious bodily reaction to an infection.  Blood clotting problems.  Secondary infections due to bacteria or fungus.  Organ failure. This is when your body's organs stop working. The virus that causes COVID-19 is contagious. This means that it can spread from person to person through droplets from coughs and sneezes (respiratory secretions). What are the causes? This illness is caused by a virus. You may catch the virus by:  Breathing in droplets from an infected person. Droplets can be spread by a person breathing, speaking, singing, coughing, or sneezing.  Touching something, like a table or a doorknob, that was exposed to the virus (contaminated) and then touching your mouth, nose, or eyes. What increases the risk? Risk for infection You  are more likely to be infected with this virus if you:  Are within 6 feet (2 meters) of a person with COVID-19.  Provide care for or live with a person who is infected with COVID-19.  Spend time in crowded indoor spaces or live in shared housing. Risk for serious illness You are more likely to become seriously ill from the virus if you:  Are 2 years of age or older. The higher your age, the more you are at risk for serious illness.  Live in a nursing home or long-term care facility.  Have cancer.  Have a long-term (chronic) disease such as: ? Chronic lung disease, including chronic obstructive pulmonary disease or asthma. ? A long-term disease that lowers your body's ability to fight infection (immunocompromised). ? Heart disease, including heart failure, a condition in which the arteries that lead to the heart become narrow or blocked (coronary artery disease), a disease which makes the heart muscle thick, weak, or stiff (cardiomyopathy). ? Diabetes. ? Chronic kidney disease. ? Sickle cell disease, a condition in which red blood cells have an abnormal "sickle" shape. ? Liver disease.  Are obese. What are the signs or symptoms? Symptoms of this condition can range from mild to severe. Symptoms may appear any time from 2 to 14 days after being exposed to the virus. They include:  A fever or chills.  A cough.  Difficulty breathing.  Headaches, body aches, or muscle aches.  Runny or stuffy (congested) nose.  A sore throat.  New loss of taste or smell.  Some people may also have stomach problems, such as nausea, vomiting, or diarrhea. Other people may not have any symptoms of COVID-19. How is this diagnosed? This condition may be diagnosed based on:  Your signs and symptoms, especially if: ? You live in an area with a COVID-19 outbreak. ? You recently traveled to or from an area where the virus is common. ? You provide care for or live with a person who was diagnosed  with COVID-19. ? You were exposed to a person who was diagnosed with COVID-19.  A physical exam.  Lab tests, which may include: ? Taking a sample of fluid from the back of your nose and throat (nasopharyngeal fluid), your nose, or your throat using a swab. ? A sample of mucus from your lungs (sputum). ? Blood tests.  Imaging tests, which may include, X-rays, CT scan, or ultrasound. How is this treated? At present, there is no medicine to treat COVID-19. Medicines that treat other diseases are being used on a trial basis to see if they are effective against COVID-19. Your health care provider will talk with you about ways to treat your symptoms. For most people, the infection is mild and can be managed at home with rest, fluids, and over-the-counter medicines. Treatment for a serious infection usually takes places in a hospital intensive care unit (ICU). It may include one or more of the following treatments. These treatments are given until your symptoms improve.  Receiving fluids and medicines through an IV.  Supplemental oxygen. Extra oxygen is given through a tube in the nose, a face mask, or a hood.  Positioning you to lie on your stomach (prone position). This makes it easier for oxygen to get into the lungs.  Continuous positive airway pressure (CPAP) or bi-level positive airway pressure (BPAP) machine. This treatment uses mild air pressure to keep the airways open. A tube that is connected to a motor delivers oxygen to the body.  Ventilator. This treatment moves air into and out of the lungs by using a tube that is placed in your windpipe.  Tracheostomy. This is a procedure to create a hole in the neck so that a breathing tube can be inserted.  Extracorporeal membrane oxygenation (ECMO). This procedure gives the lungs a chance to recover by taking over the functions of the heart and lungs. It supplies oxygen to the body and removes carbon dioxide. Follow these instructions at  home: Lifestyle  If you are sick, stay home except to get medical care. Your health care provider will tell you how long to stay home. Call your health care provider before you go for medical care.  Rest at home as told by your health care provider.  Do not use any products that contain nicotine or tobacco, such as cigarettes, e-cigarettes, and chewing tobacco. If you need help quitting, ask your health care provider.  Return to your normal activities as told by your health care provider. Ask your health care provider what activities are safe for you. General instructions  Take over-the-counter and prescription medicines only as told by your health care provider.  Drink enough fluid to keep your urine pale yellow.  Keep all follow-up visits as told by your health care provider. This is important. How is this prevented?  There is no vaccine to help prevent COVID-19 infection. However, there are steps you can take to protect yourself and others from this virus. To protect yourself:   Do not travel to areas where COVID-19 is a risk.  The areas where COVID-19 is reported change often. To identify high-risk areas and travel restrictions, check the CDC travel website: FatFares.com.br  If you live in, or must travel to, an area where COVID-19 is a risk, take precautions to avoid infection. ? Stay away from people who are sick. ? Wash your hands often with soap and water for 20 seconds. If soap and water are not available, use an alcohol-based hand sanitizer. ? Avoid touching your mouth, face, eyes, or nose. ? Avoid going out in public, follow guidance from your state and local health authorities. ? If you must go out in public, wear a cloth face covering or face mask. Make sure your mask covers your nose and mouth. ? Avoid crowded indoor spaces. Stay at least 6 feet (2 meters) away from others. ? Disinfect objects and surfaces that are frequently touched every day. This may  include:  Counters and tables.  Doorknobs and light switches.  Sinks and faucets.  Electronics, such as phones, remote controls, keyboards, computers, and tablets. To protect others: If you have symptoms of COVID-19, take steps to prevent the virus from spreading to others.  If you think you have a COVID-19 infection, contact your health care provider right away. Tell your health care team that you think you may have a COVID-19 infection.  Stay home. Leave your house only to seek medical care. Do not use public transport.  Do not travel while you are sick.  Wash your hands often with soap and water for 20 seconds. If soap and water are not available, use alcohol-based hand sanitizer.  Stay away from other members of your household. Let healthy household members care for children and pets, if possible. If you have to care for children or pets, wash your hands often and wear a mask. If possible, stay in your own room, separate from others. Use a different bathroom.  Make sure that all people in your household wash their hands well and often.  Cough or sneeze into a tissue or your sleeve or elbow. Do not cough or sneeze into your hand or into the air.  Wear a cloth face covering or face mask. Make sure your mask covers your nose and mouth. Where to find more information  Centers for Disease Control and Prevention: PurpleGadgets.be  World Health Organization: https://www.castaneda.info/ Contact a health care provider if:  You live in or have traveled to an area where COVID-19 is a risk and you have symptoms of the infection.  You have had contact with someone who has COVID-19 and you have symptoms of the infection. Get help right away if:  You have trouble breathing.  You have pain or pressure in your chest.  You have confusion.  You have bluish lips and fingernails.  You have difficulty waking from sleep.  You have symptoms that get  worse. These symptoms may represent a serious problem that is an emergency. Do not wait to see if the symptoms will go away. Get medical help right away. Call your local emergency services (911 in the [place] Do not drive yourself to the hospital. Let the emergency medical personnel know if you think you have COVID-19. Summary  COVID-19 is a respiratory infection that is caused by a virus. It is also known as coronavirus disease or novel coronavirus. It can cause serious infections, such as pneumonia, acute respiratory distress syndrome, acute respiratory failure, or sepsis.  The virus that causes COVID-19 is contagious. This means that it can spread from person  to person through droplets from breathing, speaking, singing, coughing, or sneezing.  You are more likely to develop a serious illness if you are 69 years of age or older, have a weak immune system, live in a nursing home, or have chronic disease.  There is no medicine to treat COVID-19. Your health care provider will talk with you about ways to treat your symptoms.  Take steps to protect yourself and others from infection. Wash your hands often and disinfect objects and surfaces that are frequently touched every day. Stay away from people who are sick and wear a mask if you are sick. This information is not intended to replace advice given to you by your health care provider. Make sure you discuss any questions you have with your health care provider. Document Revised: 03/14/2019 Document Reviewed: 06/20/2018 Elsevier Patient Education  2020 [employer]. You are scheduled for an outpatient infusion of Remdesivir at 8:30 AM on Saturday 2/27.  Please report to [facility] at [address].  Drive to the security guard and tell them you are here for an infusion. They will direct you to the front entrance where we will come and get you.  For questions call [phone].  Thanks

## 2019-07-25 NOTE — Progress Notes (Signed)
SATURATION QUALIFICATIONS: (This note is used to comply with regulatory documentation for home oxygen)  Patient Saturations on Room Air at Rest = 98%  Patient Saturations on Room Air while Ambulating = 91%  Patient Saturations on 0 Liters of oxygen while Ambulating = 91%  Please briefly explain why patient needs home oxygen:  Patient does not need home oxygen.

## 2019-07-25 NOTE — Progress Notes (Signed)
Written and verbal discharge instructions given to patient.  Patient verbalized understanding of instructions.  Discharged patient to home via private vehicle. 

## 2019-07-25 NOTE — Progress Notes (Signed)
Spoke with Jasmine December, the patient's spouse, and answered all patient condition questions at this time.

## 2019-07-25 NOTE — Discharge Summary (Signed)
Physician Discharge Summary  [name] VZC:[id number] DOB: [birth year] DOA: 07/23/2019  PCP: [facility]  Admit date: 07/23/2019 Discharge date: 07/25/2019  Admitted From: Home Disposition: Home  Recommendations for Outpatient Follow-up:  1. Follow up with PCP in 1-2 weeks 2. Please obtain BMP/CBC in one week  Discharge Condition: Stable CODE STATUS: Full Diet recommendation: As tolerated  Brief/Interim Summary: Patrick Klein is a 74 y.o. male with medical history significant of non-insulin-dependent diabetes type 2, diverticulosis, hypertension essential, COPD without requirement of oxygen, unspecified arthritis.  Patient presents to the ED at [facility] worsening fever over 10 days.  Patient reported [facility] for further evaluation at an urgent care and was Covid positive on 07/13/2019. Patient also had episodes of intractable nausea vomiting and diarrhea at home but this is since resolved.  Patient did see PCP and was placed on azithromycin for suspected community-acquired pneumonia with worsening shortness of breath and pleuritic chest pain.  Been worsening fevers and cough as well as shortness of breath patient was evaluated by ED found to be moderately hypoxic with bibasilar opacifications concerning for infection.  Patient subsequently sent to [facility] given Covid positive, hypoxic and requiring hospitalization.  Patient admitted as above with acute hypoxic respiratory failure the setting of COVID-19 pneumonia.  Patient was treated with dexamethasone and remdesivir, tolerated both quite well, ambulatory hypoxia has improved and today patient is now ambulating on room air without symptoms or hypoxia.  Patient will need additional dose of remdesivir on 07/26/2019 in the outpatient setting, this is been set up for Saturday, 07/26/2019 at 08:30.  Discharge Diagnoses:  Active Problems:   Acute hypoxemic respiratory failure due to COVID-19 [facility])    Discharge  Instructions  Discharge Instructions    Call MD for:  difficulty breathing, headache or visual disturbances   Complete by: As directed    Call MD for:  extreme fatigue   Complete by: As directed    Call MD for:  hives   Complete by: As directed    Call MD for:  persistant dizziness or light-headedness   Complete by: As directed    Call MD for:  persistant nausea and vomiting   Complete by: As directed    Call MD for:  severe uncontrolled pain   Complete by: As directed    Call MD for:  temperature >100.4   Complete by: As directed    Diet - low sodium heart healthy   Complete by: As directed    Increase activity slowly   Complete by: As directed      Allergies as of 07/25/2019      Reactions   Ivp Dye [iodinated Diagnostic Agents]    Shock      Medication List    STOP taking these medications   azithromycin 250 MG tablet Commonly known as: ZITHROMAX     TAKE these medications   albuterol 108 (90 Base) MCG/ACT inhaler Commonly known as: VENTOLIN HFA Inhale 1-2 puffs into the lungs every 6 (six) hours as needed for wheezing or shortness of breath.   aspirin EC 81 MG tablet Take 81 mg by mouth daily.   diazepam 5 MG tablet Commonly known as: VALIUM Take 5 mg by mouth in the morning and at bedtime.   fluticasone 50 MCG/ACT nasal spray Commonly known as: FLONASE Place 2 sprays into both nostrils daily as needed for allergies.   hydrochlorothiazide 12.5 MG tablet Commonly known as: HYDRODIURIL Take 12.5 mg by mouth daily.  irbesartan 150 MG tablet Commonly known as: AVAPRO Take 150 mg by mouth daily.   Krill Oil 350 MG Caps Take 350 mg by mouth daily.   nitroGLYCERIN 0.4 MG SL tablet Commonly known as: NITROSTAT Place 0.4 mg under the tongue every 5 (five) minutes x 3 doses as needed for chest pain.   potassium chloride 10 MEQ tablet Commonly known as: KLOR-CON Take 10 mEq by mouth daily.   pravastatin 80 MG tablet Commonly known as: PRAVACHOL Take  80 mg by mouth daily.   predniSONE 10 MG tablet Commonly known as: DELTASONE Take 4 tablets (40 mg total) by mouth daily for 3 days, THEN 3 tablets (30 mg total) daily for 3 days, THEN 2 tablets (20 mg total) daily for 3 days, THEN 1 tablet (10 mg total) daily for 3 days. Start taking on: July 25, 2019   tamsulosin 0.4 MG Caps capsule Commonly known as: FLOMAX Take 0.4 mg by mouth daily as needed (urinary issues.).   Trulicity 8.25 PG/9.8MK Sopn Generic drug: Dulaglutide Inject 0.75 mg into the skin every Wednesday.   VITAMIN D3 PO Take by mouth.   ZINC PO Take 1 tablet by mouth daily.       Allergies  Allergen Reactions  . Ivp Dye [Iodinated Diagnostic Agents]     Shock     Procedures/Studies: DG Chest Port 1 View  Result Date: 07/23/2019 CLINICAL DATA:  Fever. COVID-19 positive. EXAM: PORTABLE CHEST 1 VIEW COMPARISON:  None. FINDINGS: Mild cardiomegaly is noted. No pneumothorax or pleural effusion is noted. Mild ill-defined bilateral lung opacities are noted, right greater than left, concerning for multifocal pneumonia. Bony thorax is unremarkable. IMPRESSION: Mild ill-defined bilateral lung opacities are noted, right greater than left, concerning for multifocal pneumonia. Electronically Signed   By: Marijo Conception M.D.   On: 07/23/2019 10:10    Subjective: No acute issues or events overnight   Discharge Exam: Vitals:   07/25/19 0743 07/25/19 1128  BP: 128/87 138/86  Pulse: 66 99  Resp: 15 15  Temp: 97.8 F (36.6 C) 97.9 F (36.6 C)  SpO2: 100% 99%   Vitals:   07/24/19 2334 07/25/19 0317 07/25/19 0743 07/25/19 1128  BP: 123/73 120/67 128/87 138/86  Pulse: 77 64 66 99  Resp: 20 18 15 15   Temp: (!) 97.5 F (36.4 C) 97.8 F (36.6 C) 97.8 F (36.6 C) 97.9 F (36.6 C)  TempSrc: Oral Oral Oral Oral  SpO2: 99% 100% 100% 99%  Weight:      Height:        General:  Pleasantly resting in bed, No acute distress. HEENT:  Normocephalic atraumatic.  Sclerae  nonicteric, noninjected.  Extraocular movements intact bilaterally. Neck:  Without mass or deformity.  Trachea is midline. Lungs:  Clear to auscultate bilaterally without rhonchi, wheeze, or rales. Heart:  Regular rate and rhythm.  Without murmurs, rubs, or gallops. Abdomen:  Soft, nontender, nondistended.  Without guarding or rebound. Extremities: Without cyanosis, clubbing, edema, or obvious deformity. Vascular:  Dorsalis pedis and posterior tibial pulses palpable bilaterally. Skin:  Warm and dry, no erythema, no ulcerations.   The results of significant diagnostics from this hospitalization (including imaging, microbiology, ancillary and laboratory) are listed below for reference.     Microbiology: Recent Results (from the past 240 hour(s))  Blood Culture (routine x 2)     Status: None (Preliminary result)   Collection Time: 07/23/19  9:45 AM   Specimen: Left Antecubital; Blood  Result Value Ref Range Status  Specimen Description LEFT ANTECUBITAL  Final   Special Requests   Final    BOTTLES DRAWN AEROBIC AND ANAEROBIC Blood Culture adequate volume Performed at [facility], [address] [place], [place] [address]    Culture NO GROWTH 1 DAY  Final   Report Status PENDING  Incomplete  SARS Coronavirus 2 Ag (30 min TAT) - Nasal Swab (BD Veritor Kit)     Status: None   Collection Time: 07/23/19  9:48 AM   Specimen: Nasal Swab (BD Veritor Kit)  Result Value Ref Range Status   SARS Coronavirus 2 Ag NEGATIVE NEGATIVE Final    Comment: (NOTE) SARS-CoV-2 antigen NOT DETECTED.  Negative results are presumptive.  Negative results do not preclude SARS-CoV-2 infection and should not be used as the sole basis for treatment or other patient management decisions, including infection  control decisions, particularly in the presence of clinical signs and  symptoms consistent with COVID-19, or in those who have been in contact with the virus.  Negative results must be combined  with clinical observations, patient history, and epidemiological information. The expected result is Negative. Fact Sheet for Patients: PodPark.tn Fact Sheet for Healthcare Providers: GiftContent.is This test is not yet approved or cleared by the [place] FDA and  has been authorized for detection and/or diagnosis of SARS-CoV-2 by FDA under an Emergency Use Authorization (EUA).  This EUA will remain in effect (meaning this test can be used) for the duration of  the COVID-19 de claration under Section 564(b)(1) of the Act, 21 U.S.C. section 360bbb-3(b)(1), unless the authorization is terminated or revoked sooner. Performed at [facility], [address] [place], [place] [address]   Blood Culture (routine x 2)     Status: None (Preliminary result)   Collection Time: 07/23/19  9:55 AM   Specimen: BLOOD LEFT HAND  Result Value Ref Range Status   Specimen Description BLOOD LEFT HAND  Final   Special Requests   Final    BOTTLES DRAWN AEROBIC AND ANAEROBIC Blood Culture adequate volume Performed at [facility], [address] [place], [place] [address]    Culture NO GROWTH 1 DAY  Final   Report Status PENDING  Incomplete  SARS CORONAVIRUS 2 (TAT 6-24 HRS) Nasopharyngeal Nasopharyngeal Swab     Status: Abnormal   Collection Time: 07/23/19 11:19 AM   Specimen: Nasopharyngeal Swab  Result Value Ref Range Status   SARS Coronavirus 2 POSITIVE (A) NEGATIVE Final    Comment: RESULT CALLED TO, READ BACK BY AND VERIFIED WITH: C.DOWLER RN 2106 07/23/19 MCCORMICK K (NOTE) SARS-CoV-2 target nucleic acids are DETECTED. The SARS-CoV-2 RNA is generally detectable in upper and lower respiratory specimens during the acute phase of infection. Positive results are indicative of the presence of SARS-CoV-2 RNA. Clinical correlation with patient history and other diagnostic information is  necessary to determine  patient infection status. Positive results do not rule out bacterial infection or co-infection with other viruses.  The expected result is Negative. Fact Sheet for Patients: SugarRoll.be Fact Sheet for Healthcare Providers: https://www.woods-mathews.com/ This test is not yet approved or cleared by the [place] FDA and  has been authorized for detection and/or diagnosis of SARS-CoV-2 by FDA under an Emergency Use Authorization (EUA). This EUA will remain  in effect (meaning this test can be used) for t he duration of the COVID-19 declaration under Section 564(b)(1) of the Act, 21 U.S.C. section 360bbb-3(b)(1), unless the authorization is terminated or revoked  sooner. Performed at [facility], [place], [place], [place] [address]      Labs: BNP (last 3 results) No results for input(s): BNP in the last 8760 hours. Basic Metabolic Panel: Recent Labs  Lab 07/23/19 0948 07/24/19 0200 07/25/19 0307  NA 134* 140 141  K 2.9* 3.7 3.6  CL 99 103 105  CO2 27 28 28   GLUCOSE 146* 160* 148*  BUN 13 23 28*  CREATININE 0.67 0.69 0.61  CALCIUM 8.4* 8.7* 8.6*   Liver Function Tests: Recent Labs  Lab 07/23/19 0948 07/24/19 0200 07/25/19 0307  AST 30 29 30   ALT 19 23 29   ALKPHOS 51 51 52  BILITOT 0.8 0.5 0.6  PROT 6.8 6.8 6.3*  ALBUMIN 3.4* 3.2* 3.0*   No results for input(s): LIPASE, AMYLASE in the last 168 hours. No results for input(s): AMMONIA in the last 168 hours. CBC: Recent Labs  Lab 07/23/19 0948 07/24/19 0200 07/25/19 0307  WBC 3.9* 2.9* 7.0  NEUTROABS 2.9  --   --   HGB 13.6 13.1 12.4*  HCT 40.0 39.6 37.0*  MCV 79.4* 81.0 79.9*  PLT 148* 164 193   Cardiac Enzymes: No results for input(s): CKTOTAL, CKMB, CKMBINDEX, TROPONINI in the last 168 hours. BNP: Invalid input(s): POCBNP CBG: Recent Labs  Lab 07/23/19 1720 07/23/19 2211 07/24/19 0736 07/24/19 1121 07/24/19 1620  GLUCAP 180* 273* 157* 265*  237*   D-Dimer Recent Labs    07/24/19 0200 07/25/19 0307  DDIMER 1.23* 1.12*   Hgb A1c Recent Labs    07/24/19 0200  HGBA1C 5.7*   Lipid Profile Recent Labs    07/23/19 0948  TRIG 69   Thyroid function studies No results for input(s): TSH, T4TOTAL, T3FREE, THYROIDAB in the last 72 hours.  Invalid input(s): FREET3 Anemia work up Recent Labs    07/23/19 0948  FERRITIN 174   Urinalysis    Component Value Date/Time   COLORURINE YELLOW 01/17/2018 [place] 01/17/2018 1013   LABSPEC 1.013 01/17/2018 1013   PHURINE 6.5 01/17/2018 1013   GLUCOSEU NEGATIVE 01/17/2018 1013   HGBUR NEGATIVE 01/17/2018 1013   KETONESUR NEGATIVE 01/17/2018 1013   PROTEINUR NEGATIVE 01/17/2018 1013   NITRITE NEGATIVE 01/17/2018 1013   LEUKOCYTESUR NEGATIVE 01/17/2018 1013   Sepsis Labs Invalid input(s): PROCALCITONIN,  WBC,  LACTICIDVEN Microbiology Recent Results (from the past 240 hour(s))  Blood Culture (routine x 2)     Status: None (Preliminary result)   Collection Time: 07/23/19  9:45 AM   Specimen: Left Antecubital; Blood  Result Value Ref Range Status   Specimen Description LEFT ANTECUBITAL  Final   Special Requests   Final    BOTTLES DRAWN AEROBIC AND ANAEROBIC Blood Culture adequate volume Performed at [facility], [address] [place], [place] [address]    Culture NO GROWTH 1 DAY  Final   Report Status PENDING  Incomplete  SARS Coronavirus 2 Ag (30 min TAT) - Nasal Swab (BD Veritor Kit)     Status: None   Collection Time: 07/23/19  9:48 AM   Specimen: Nasal Swab (BD Veritor Kit)  Result Value Ref Range Status   SARS Coronavirus 2 Ag NEGATIVE NEGATIVE Final    Comment: (NOTE) SARS-CoV-2 antigen NOT DETECTED.  Negative results are presumptive.  Negative results do not preclude SARS-CoV-2 infection and should not be used as the sole basis for treatment or other patient management decisions, including infection  control decisions,  particularly in  the presence of clinical signs and  symptoms consistent with COVID-19, or in those who have been in contact with the virus.  Negative results must be combined with clinical observations, patient history, and epidemiological information. The expected result is Negative. Fact Sheet for Patients: PodPark.tn Fact Sheet for Healthcare Providers: GiftContent.is This test is not yet approved or cleared by the [place] FDA and  has been authorized for detection and/or diagnosis of SARS-CoV-2 by FDA under an Emergency Use Authorization (EUA).  This EUA will remain in effect (meaning this test can be used) for the duration of  the COVID-19 de claration under Section 564(b)(1) of the Act, 21 U.S.C. section 360bbb-3(b)(1), unless the authorization is terminated or revoked sooner. Performed at [facility], [address] [place], [place] [address]   Blood Culture (routine x 2)     Status: None (Preliminary result)   Collection Time: 07/23/19  9:55 AM   Specimen: BLOOD LEFT HAND  Result Value Ref Range Status   Specimen Description BLOOD LEFT HAND  Final   Special Requests   Final    BOTTLES DRAWN AEROBIC AND ANAEROBIC Blood Culture adequate volume Performed at [facility], [address] [place], [place] [address]    Culture NO GROWTH 1 DAY  Final   Report Status PENDING  Incomplete  SARS CORONAVIRUS 2 (TAT 6-24 HRS) Nasopharyngeal Nasopharyngeal Swab     Status: Abnormal   Collection Time: 07/23/19 11:19 AM   Specimen: Nasopharyngeal Swab  Result Value Ref Range Status   SARS Coronavirus 2 POSITIVE (A) NEGATIVE Final    Comment: RESULT CALLED TO, READ BACK BY AND VERIFIED WITH: C.DOWLER RN 2106 07/23/19 MCCORMICK K (NOTE) SARS-CoV-2 target nucleic acids are DETECTED. The SARS-CoV-2 RNA is generally detectable in upper and lower respiratory specimens during the acute phase of infection.  Positive results are indicative of the presence of SARS-CoV-2 RNA. Clinical correlation with patient history and other diagnostic information is  necessary to determine patient infection status. Positive results do not rule out bacterial infection or co-infection with other viruses.  The expected result is Negative. Fact Sheet for Patients: SugarRoll.be Fact Sheet for Healthcare Providers: https://www.woods-mathews.com/ This test is not yet approved or cleared by the [place] FDA and  has been authorized for detection and/or diagnosis of SARS-CoV-2 by FDA under an Emergency Use Authorization (EUA). This EUA will remain  in effect (meaning this test can be used) for t he duration of the COVID-19 declaration under Section 564(b)(1) of the Act, 21 U.S.C. section 360bbb-3(b)(1), unless the authorization is terminated or revoked sooner. Performed at [facility], [place] [address] [place], [place] [address]      Time coordinating discharge: Over 30 minutes  SIGNED:   Little Ishikawa, DO [facility] 07/25/2019, 2:15 PM Pager   If 7PM-7AM, please contact night-coverage [email or web address]

## 2019-07-26 ENCOUNTER — Ambulatory Visit (HOSPITAL_COMMUNITY)
Admission: RE | Admit: 2019-07-26 | Discharge: 2019-07-26 | Disposition: A | Discharge status: Home / Self Care | Payer: Medicare Other | Source: Ambulatory Visit | Attending: Pulmonary Disease | Admitting: Pulmonary Disease

## 2019-07-26 VITALS — BP 136/80 | HR 76 | Temp 98.0°F | Resp 18

## 2019-07-26 DIAGNOSIS — U071 COVID-19: Secondary | ICD-10-CM | POA: Insufficient documentation

## 2019-07-26 DIAGNOSIS — J9601 Acute respiratory failure with hypoxia: Secondary | ICD-10-CM

## 2019-07-26 MED ORDER — SODIUM CHLORIDE 0.9 % IV SOLN: 100.0000 mg | Freq: Once | INTRAVENOUS | Status: DC

## 2019-07-26 MED ORDER — METHYLPREDNISOLONE SODIUM SUCC 125 MG IJ SOLR: 125.0000 mg | Freq: Once | INTRAMUSCULAR | Status: DC | PRN

## 2019-07-26 MED ORDER — DIPHENHYDRAMINE HCL 50 MG/ML IJ SOLN: 50.0000 mg | Freq: Once | INTRAMUSCULAR | Status: DC | PRN

## 2019-07-26 MED ORDER — SODIUM CHLORIDE 0.9 % IV SOLN
100.0000 mg | Freq: Once | INTRAVENOUS | Status: AC
  Administered 2019-07-26: 09:00:00 100 mg via INTRAVENOUS
  Filled 2019-07-26: qty 20

## 2019-07-26 MED ORDER — ALBUTEROL SULFATE HFA 108 (90 BASE) MCG/ACT IN AERS: 2.0000 | INHALATION_SPRAY | Freq: Once | RESPIRATORY_TRACT | Status: DC | PRN

## 2019-07-26 MED ORDER — SODIUM CHLORIDE 0.9 % IV SOLN: INTRAVENOUS | Status: DC | PRN

## 2019-07-26 MED ORDER — EPINEPHRINE 0.3 MG/0.3ML IJ SOAJ: 0.3000 mg | Freq: Once | INTRAMUSCULAR | Status: DC | PRN

## 2019-07-26 MED ORDER — FAMOTIDINE IN NACL 20-0.9 MG/50ML-% IV SOLN: 20.0000 mg | Freq: Once | INTRAVENOUS | Status: DC | PRN

## 2019-07-26 NOTE — Progress Notes (Signed)
  Diagnosis: COVID-19  Physician: Dr. Delford Field  Procedure: [facility] Med: remdesivir infusion.  Complications: No immediate complications noted.  Discharge: Discharged home   Essie Hart 07/26/2019

## 2019-07-26 NOTE — Discharge Instructions (Signed)
10 Things You Can Do to Manage Your COVID-19 Symptoms at Home If you have possible or confirmed COVID-19: 1. Stay home from work and school. And stay away from other public places. If you must go out, avoid using any kind of public transportation, ridesharing, or taxis. 2. Monitor your symptoms carefully. If your symptoms get worse, call your healthcare provider immediately. 3. Get rest and stay hydrated. 4. If you have a medical appointment, call the healthcare provider ahead of time and tell them that you have or may have COVID-19. 5. For medical emergencies, call 911 and notify the dispatch personnel that you have or may have COVID-19. 6. Cover your cough and sneezes with a tissue or use the inside of your elbow. 7. Wash your hands often with soap and water for at least 20 seconds or clean your hands with an alcohol-based hand sanitizer that contains at least 60% alcohol. 8. As much as possible, stay in a specific room and away from other people in your home. Also, you should use a separate bathroom, if available. If you need to be around other people in or outside of the home, wear a mask. 9. Avoid sharing personal items with other people in your household, like dishes, towels, and bedding. 10. Clean all surfaces that are touched often, like counters, tabletops, and doorknobs. Use household cleaning sprays or wipes according to the label instructions. cdc.gov/coronavirus 11/27/2018 This information is not intended to replace advice given to you by your health care provider. Make sure you discuss any questions you have with your health care provider. Document Revised: 05/01/2019 Document Reviewed: 05/01/2019 Elsevier Patient Education  2020 Elsevier Inc.  

## 2019-07-28 LAB — CULTURE, BLOOD (ROUTINE X 2)
Culture: NO GROWTH
Culture: NO GROWTH
Special Requests: ADEQUATE
Special Requests: ADEQUATE

## 2019-07-31 DIAGNOSIS — E118 Type 2 diabetes mellitus with unspecified complications: Secondary | ICD-10-CM | POA: Diagnosis not present

## 2019-07-31 DIAGNOSIS — U071 COVID-19: Secondary | ICD-10-CM | POA: Diagnosis not present

## 2019-07-31 DIAGNOSIS — J449 Chronic obstructive pulmonary disease, unspecified: Secondary | ICD-10-CM | POA: Diagnosis not present

## 2019-07-31 DIAGNOSIS — J189 Pneumonia, unspecified organism: Secondary | ICD-10-CM | POA: Diagnosis not present

## 2019-08-05 DIAGNOSIS — J449 Chronic obstructive pulmonary disease, unspecified: Secondary | ICD-10-CM | POA: Diagnosis not present

## 2019-08-07 DIAGNOSIS — Z79899 Other long term (current) drug therapy: Secondary | ICD-10-CM | POA: Diagnosis not present

## 2019-08-07 DIAGNOSIS — E118 Type 2 diabetes mellitus with unspecified complications: Secondary | ICD-10-CM | POA: Diagnosis not present

## 2019-08-07 DIAGNOSIS — J449 Chronic obstructive pulmonary disease, unspecified: Secondary | ICD-10-CM | POA: Diagnosis not present

## 2019-08-07 DIAGNOSIS — J9611 Chronic respiratory failure with hypoxia: Secondary | ICD-10-CM | POA: Diagnosis not present

## 2019-08-07 DIAGNOSIS — E785 Hyperlipidemia, unspecified: Secondary | ICD-10-CM | POA: Diagnosis not present

## 2019-08-12 DIAGNOSIS — J449 Chronic obstructive pulmonary disease, unspecified: Secondary | ICD-10-CM | POA: Diagnosis not present

## 2019-08-12 DIAGNOSIS — R0602 Shortness of breath: Secondary | ICD-10-CM | POA: Diagnosis not present

## 2019-08-12 DIAGNOSIS — Z1159 Encounter for screening for other viral diseases: Secondary | ICD-10-CM | POA: Diagnosis not present

## 2019-08-12 DIAGNOSIS — U071 COVID-19: Secondary | ICD-10-CM | POA: Diagnosis not present

## 2019-08-12 DIAGNOSIS — J8410 Pulmonary fibrosis, unspecified: Secondary | ICD-10-CM | POA: Diagnosis not present

## 2019-08-19 DIAGNOSIS — R739 Hyperglycemia, unspecified: Secondary | ICD-10-CM | POA: Diagnosis not present

## 2019-08-19 DIAGNOSIS — E119 Type 2 diabetes mellitus without complications: Secondary | ICD-10-CM | POA: Diagnosis not present

## 2019-08-19 DIAGNOSIS — J8410 Pulmonary fibrosis, unspecified: Secondary | ICD-10-CM | POA: Diagnosis not present

## 2019-08-19 DIAGNOSIS — J449 Chronic obstructive pulmonary disease, unspecified: Secondary | ICD-10-CM | POA: Diagnosis not present

## 2019-08-27 DIAGNOSIS — I10 Essential (primary) hypertension: Secondary | ICD-10-CM | POA: Diagnosis not present

## 2019-08-27 DIAGNOSIS — J449 Chronic obstructive pulmonary disease, unspecified: Secondary | ICD-10-CM | POA: Diagnosis not present

## 2019-08-27 DIAGNOSIS — E118 Type 2 diabetes mellitus with unspecified complications: Secondary | ICD-10-CM | POA: Diagnosis not present

## 2019-08-27 DIAGNOSIS — Z6827 Body mass index (BMI) 27.0-27.9, adult: Secondary | ICD-10-CM | POA: Diagnosis not present

## 2019-09-02 DIAGNOSIS — J8410 Pulmonary fibrosis, unspecified: Secondary | ICD-10-CM | POA: Diagnosis not present

## 2019-09-02 DIAGNOSIS — J449 Chronic obstructive pulmonary disease, unspecified: Secondary | ICD-10-CM | POA: Diagnosis not present

## 2019-09-02 DIAGNOSIS — U071 COVID-19: Secondary | ICD-10-CM | POA: Diagnosis not present

## 2019-09-19 DIAGNOSIS — E785 Hyperlipidemia, unspecified: Secondary | ICD-10-CM | POA: Diagnosis not present

## 2019-09-19 DIAGNOSIS — Z125 Encounter for screening for malignant neoplasm of prostate: Secondary | ICD-10-CM | POA: Diagnosis not present

## 2019-09-19 DIAGNOSIS — Z0000 Encounter for general adult medical examination without abnormal findings: Secondary | ICD-10-CM | POA: Diagnosis not present

## 2019-09-19 DIAGNOSIS — Z9181 History of falling: Secondary | ICD-10-CM | POA: Diagnosis not present

## 2019-09-19 DIAGNOSIS — Z1331 Encounter for screening for depression: Secondary | ICD-10-CM | POA: Diagnosis not present

## 2019-09-23 DIAGNOSIS — U071 COVID-19: Secondary | ICD-10-CM | POA: Diagnosis not present

## 2019-09-23 DIAGNOSIS — J1282 Pneumonia due to coronavirus disease 2019: Secondary | ICD-10-CM | POA: Diagnosis not present

## 2019-10-07 DIAGNOSIS — E118 Type 2 diabetes mellitus with unspecified complications: Secondary | ICD-10-CM | POA: Diagnosis not present

## 2019-10-07 DIAGNOSIS — J449 Chronic obstructive pulmonary disease, unspecified: Secondary | ICD-10-CM | POA: Diagnosis not present

## 2019-10-07 DIAGNOSIS — I10 Essential (primary) hypertension: Secondary | ICD-10-CM | POA: Diagnosis not present

## 2019-10-07 DIAGNOSIS — F419 Anxiety disorder, unspecified: Secondary | ICD-10-CM | POA: Diagnosis not present

## 2019-10-20 DIAGNOSIS — R0602 Shortness of breath: Secondary | ICD-10-CM | POA: Diagnosis not present

## 2019-10-20 DIAGNOSIS — J189 Pneumonia, unspecified organism: Secondary | ICD-10-CM | POA: Diagnosis not present

## 2019-10-20 DIAGNOSIS — J449 Chronic obstructive pulmonary disease, unspecified: Secondary | ICD-10-CM | POA: Diagnosis not present

## 2019-10-29 DIAGNOSIS — J8410 Pulmonary fibrosis, unspecified: Secondary | ICD-10-CM | POA: Diagnosis not present

## 2019-12-08 DIAGNOSIS — Z79899 Other long term (current) drug therapy: Secondary | ICD-10-CM | POA: Diagnosis not present

## 2019-12-08 DIAGNOSIS — J449 Chronic obstructive pulmonary disease, unspecified: Secondary | ICD-10-CM | POA: Diagnosis not present

## 2019-12-08 DIAGNOSIS — Z6828 Body mass index (BMI) 28.0-28.9, adult: Secondary | ICD-10-CM | POA: Diagnosis not present

## 2019-12-08 DIAGNOSIS — Z6827 Body mass index (BMI) 27.0-27.9, adult: Secondary | ICD-10-CM | POA: Diagnosis not present

## 2019-12-08 DIAGNOSIS — E118 Type 2 diabetes mellitus with unspecified complications: Secondary | ICD-10-CM | POA: Diagnosis not present

## 2019-12-08 DIAGNOSIS — F419 Anxiety disorder, unspecified: Secondary | ICD-10-CM | POA: Diagnosis not present

## 2019-12-08 DIAGNOSIS — E785 Hyperlipidemia, unspecified: Secondary | ICD-10-CM | POA: Diagnosis not present

## 2019-12-08 DIAGNOSIS — I10 Essential (primary) hypertension: Secondary | ICD-10-CM | POA: Diagnosis not present

## 2020-05-04 DIAGNOSIS — F419 Anxiety disorder, unspecified: Secondary | ICD-10-CM | POA: Diagnosis not present

## 2020-05-04 DIAGNOSIS — I10 Essential (primary) hypertension: Secondary | ICD-10-CM | POA: Diagnosis not present

## 2020-05-04 DIAGNOSIS — Z6828 Body mass index (BMI) 28.0-28.9, adult: Secondary | ICD-10-CM | POA: Diagnosis not present

## 2020-05-04 DIAGNOSIS — Z79899 Other long term (current) drug therapy: Secondary | ICD-10-CM | POA: Diagnosis not present

## 2020-05-04 DIAGNOSIS — E118 Type 2 diabetes mellitus with unspecified complications: Secondary | ICD-10-CM | POA: Diagnosis not present

## 2020-05-04 DIAGNOSIS — G629 Polyneuropathy, unspecified: Secondary | ICD-10-CM | POA: Diagnosis not present

## 2020-05-04 DIAGNOSIS — E785 Hyperlipidemia, unspecified: Secondary | ICD-10-CM | POA: Diagnosis not present

## 2020-06-22 IMAGING — DX DG CHEST 1V PORT
1 series · 1 of 1 positions shown · non-contrast
Comparison: None.

CLINICAL DATA: Fever. [id number] positive.

EXAM:
PORTABLE CHEST 1 VIEW

[chest ap]
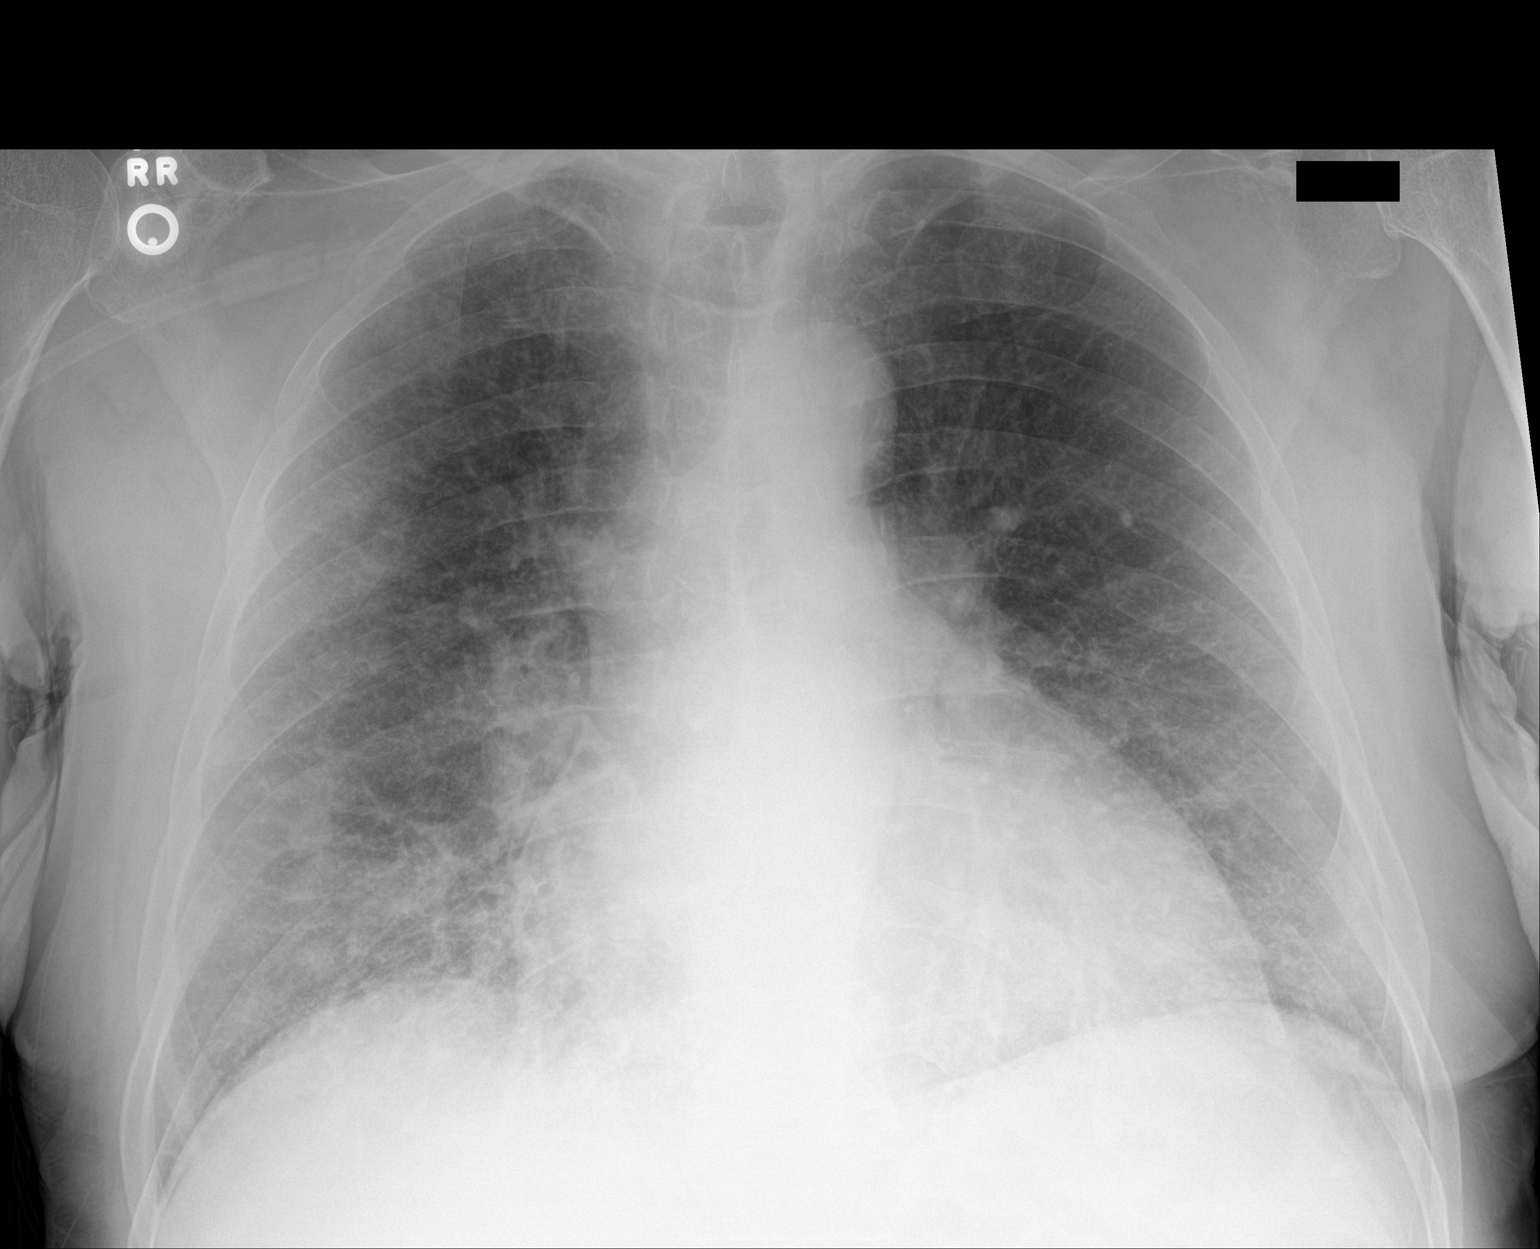

[1 of 1 positions shown; findings below may reference images not displayed]

FINDINGS: Mild cardiomegaly is noted. No pneumothorax or pleural effusion is
noted. Mild ill-defined bilateral lung opacities are noted, right
greater than left, concerning for multifocal pneumonia. Bony thorax
is unremarkable.
IMPRESSION: Mild ill-defined bilateral lung opacities are noted, right greater
than left, concerning for multifocal pneumonia.

## 2021-10-27 DIAGNOSIS — I480 Paroxysmal atrial fibrillation: Secondary | ICD-10-CM

## 2021-10-27 HISTORY — DX: Paroxysmal atrial fibrillation: I48.0

## 2021-12-27 DIAGNOSIS — I350 Nonrheumatic aortic (valve) stenosis: Secondary | ICD-10-CM

## 2021-12-27 HISTORY — DX: Nonrheumatic aortic (valve) stenosis: I35.0

## 2022-01-03 HISTORY — PX: TRANSTHORACIC ECHOCARDIOGRAM: SHX275

## 2022-06-02 DIAGNOSIS — M1711 Unilateral primary osteoarthritis, right knee: Secondary | ICD-10-CM | POA: Diagnosis not present

## 2022-06-05 ENCOUNTER — Encounter: Payer: Self-pay | Admitting: Cardiology

## 2022-06-05 ENCOUNTER — Ambulatory Visit: Payer: Medicare PPO | Attending: Cardiology | Admitting: Cardiology

## 2022-06-05 VITALS — BP 144/78 | HR 68 | Ht 68.0 in | Wt 196.8 lb

## 2022-06-05 DIAGNOSIS — I350 Nonrheumatic aortic (valve) stenosis: Secondary | ICD-10-CM | POA: Diagnosis not present

## 2022-06-05 DIAGNOSIS — E1169 Type 2 diabetes mellitus with other specified complication: Secondary | ICD-10-CM | POA: Diagnosis not present

## 2022-06-05 DIAGNOSIS — I2102 ST elevation (STEMI) myocardial infarction involving left anterior descending coronary artery: Secondary | ICD-10-CM | POA: Diagnosis not present

## 2022-06-05 DIAGNOSIS — E785 Hyperlipidemia, unspecified: Secondary | ICD-10-CM

## 2022-06-05 DIAGNOSIS — I10 Essential (primary) hypertension: Secondary | ICD-10-CM

## 2022-06-05 DIAGNOSIS — D6869 Other thrombophilia: Secondary | ICD-10-CM | POA: Diagnosis not present

## 2022-06-05 DIAGNOSIS — Z9861 Coronary angioplasty status: Secondary | ICD-10-CM

## 2022-06-05 DIAGNOSIS — I480 Paroxysmal atrial fibrillation: Secondary | ICD-10-CM

## 2022-06-05 DIAGNOSIS — Z01810 Encounter for preprocedural cardiovascular examination: Secondary | ICD-10-CM

## 2022-06-05 DIAGNOSIS — I2510 Atherosclerotic heart disease of native coronary artery without angina pectoris: Secondary | ICD-10-CM | POA: Diagnosis not present

## 2022-06-05 DIAGNOSIS — I4891 Unspecified atrial fibrillation: Secondary | ICD-10-CM | POA: Insufficient documentation

## 2022-06-05 NOTE — Patient Instructions (Addendum)
Medication Instructions:    Continue taking Diltiazem  120 mg unto  2 weeks after your knee surgery , then wean off medication by taking medication every other day for 2 weeks the stop.  After 2 weeks off medication if you notice increase heart rate or you develop Afib then restart medication      *If you need a refill on your cardiac medications before your next appointment, please call your pharmacy*   Lab Work: Not needed    Testing/Procedures:   Will be schedule in May or June 2024 at [facility] has requested that you have an echocardiogram. Echocardiography is a painless test that uses sound waves to create images of your heart. It provides your doctor with information about the size and shape of your heart and how well your heart's chambers and valves are working. This procedure takes approximately one hour. There are no restrictions for this procedure. Please do NOT wear cologne, perfume, aftershave, or lotions (deodorant is allowed). Please arrive 15 minutes prior to your appointment time.   Follow-Up: At [facility], you and your health needs are our priority.  As part of our continuing mission to provide you with exceptional heart care, we have created designated Provider Care Teams.  These Care Teams include your primary Cardiologist (physician) and Advanced Practice Providers (APPs -  Physician Assistants and Nurse Practitioners) who all work together to provide you with the care you need, when you need it.     Your next appointment:   4 to 5 month(s)  The format for your next appointment:   In Person  Provider:   Glenetta Hew, MD    Other Instructions   You do have clearance for Knee surgery in Feb with Dr Alvan Dame - may stop Eliquis 2- 3 days prior to surgery . Continue taking Diltiazem  120 mg daily .

## 2022-06-05 NOTE — Progress Notes (Signed)
Primary Care Provider: O'Buch, Greta, [place] Cardiologist: Glenetta Hew, MD Electrophysiologist: None -> Previously seen at [facility] -Dr. Nira Conn Orthopedic Surgeon: Dr. Melina Schools  Clinic Note: Chief Complaint  Patient presents with   New Patient (Initial Visit)    Changing cardiologist   Coronary Artery Disease    MI - PCI LAD 2013 - NO angina   Atrial Fibrillation    1 episode only   Pre-op Exam    Pending a right knee surgery    ===================================  ASSESSMENT/PLAN   Problem List Items Addressed This Visit       Cardiology Problems   h/o ST elevation (STEMI) myocardial infarction involving left anterior descending coronary artery (HCC) (Chronic)    11 years out from his MI.  He had an echocardiogram done last year which showed normal EF.  Has not actively had any anginal symptoms or any heart failure symptoms. Did appear to have been an apical infarct on the echocardiogram but preserved EF.      Relevant Medications   terazosin (HYTRIN) 1 MG capsule   diltiazem (DILACOR XR) 120 MG 24 hr capsule   CAD S/P percutaneous coronary angioplasty (Chronic)    History of anterior STEMI with likely LAD PCI.  Unfortunately he does not have any data on this.  We will try to get records from [place], [place]. Thankfully he does not seem to be having any anginal symptoms with the amount of activity he is able to do.  No chest pain or pressure no exertional dyspnea.  All the only symptom he notices is fatigue.  With no active symptoms and at least 8 METS, I do not think we actually need to stress test at this point especially with plan for potential knee surgery is coming up.  He has not been taking the diltiazem routinely but I would like for her to go and take that at least until his surgery.  Will then potentially consider holding it postop and see if how that helps his fatigue.  He remains on aspirin  along with his high-dose pravastatin.  Not currently taking Eliquis.  Need to confirm if he is or is taking Eliquis.  If he is, we could have him stop aspirin..      Relevant Medications   terazosin (HYTRIN) 1 MG capsule   diltiazem (DILACOR XR) 120 MG 24 hr capsule   Other Relevant Orders   EKG 12-Lead (Completed)   ECHOCARDIOGRAM COMPLETE   PAF (paroxysmal atrial fibrillation) (HCC) (Chronic)    Far as he can tell, he only that 1 episode was associated with taking decongestant medicines for cold.  I did simply recommend that he avoid decongestants.  We reviewed what medicines are decongestants.  He is currently on diltiazem and Eliquis. He is having fatigue and we can see how he does off the diltiazem, but would like for this to be after he has his knee surgery done.  He can then discontinue diltiazem postop.  After that we will need to further titrate medications for blood pressure weaning probably adding amlodipine.  Not using beta-blocker because of the fatigue issue as well.  Need to confirm if he is taking Eliquis-it was not on his list for [place] to review, but was on the list from his PCP.       Relevant Medications   terazosin (HYTRIN) 1 MG capsule   diltiazem (DILACOR XR) 120 MG 24 hr capsule   Other Relevant  Orders   EKG 12-Lead (Completed)   Moderate aortic stenosis by prior echocardiogram (Chronic)    Echo diagnosed in the setting of A-fib RVR evaluation.  This showed moderate AAS gradient of 26 mmHg.  We spent some time discussing aortic stenosis.  We will order follow-up study for this fall just to confirm stability.  This would not preclude him from having any surgeries.      Relevant Medications   terazosin (HYTRIN) 1 MG capsule   diltiazem (DILACOR XR) 120 MG 24 hr capsule   Other Relevant Orders   ECHOCARDIOGRAM COMPLETE   Hyperlipidemia associated with type 2 diabetes mellitus (HCC) (Chronic)    Lipid panel looks great from last September on pravastatin.  For  now he is stable on that dose and tolerating it well.  Last A1c was also pretty well-controlled at 5.9.  He is only on Trulicity      Relevant Medications   terazosin (HYTRIN) 1 MG capsule   diltiazem (DILACOR XR) 120 MG 24 hr capsule   Essential hypertension - Primary (Chronic)    Blood pressure is high today.  Has been high in the past at PCPs office as well.  He was did not take diltiazem which is reasonable because we want to see if this is may be contributing to fatigue.  However we will need to adjust blood pressure.  Based on the initial interaction that we had today, I did not feel like broaching that topic.  We can consider titrating up his ARB and HCTZ in the future.      Relevant Medications   terazosin (HYTRIN) 1 MG capsule   diltiazem (DILACOR XR) 120 MG 24 hr capsule     Other   Preop cardiovascular exam    10 years out from MI with LAD PCI.  Had a recent echocardiogram showed normal preserved EF.  In the absence of active anginal symptoms I will see any reason to proceed with any further stress testing.  He was reluctant to go forward with testing anyway.  Risk factors include diabetes, not on insulin history of CAD but no active symptoms.  Surgery is low risk from a cardiac standpoint.    As such, he will be at least low to intermediate risk for upcoming knee surgery.  I would like for him to continue his diltiazem at least daily as had his knee surgery.      Relevant Orders   EKG 12-Lead (Completed)   Hypercoagulability due to atrial fibrillation (HCC) (Chronic)    With 1 episode of A-fib, he was started on Eliquis and as far as I can tell was still on it when he was seen by his PCP but is not currently listed on his med list. => Recommended continued use of Eliquis.  Eliquis can be held preop for surgeries or procedures.  For hip surgery would probably be beneficial to hold for 48 to 72 hours.  This patients CHA2DS2-VASc Score and unadjusted Ischemic Stroke Rate (%  per year) is equal to 7.2 % stroke rate/year from a score of 5  Above score calculated as 1 point each if present [CHF, HTN, DM, Vascular=MI/PAD/Aortic Plaque, Age if 65-74, or Male] Above score calculated as 2 points each if present [Age > 75, or Stroke/TIA/TE]        ===================================  HPI:    Patrick Klein is a 77 y.o. male with Cardiac History with CRF's noted below who is being seen today to establish new cardiology care at  the request of O'Buch, Greta, PA-C.  Non-STEMI-> 2013 (while in [place]) -> s/p PCI PAF -> 1 episode that occurred after taking decongestant for cold (July 2023) Moderate aortic stenosis -> noted on echocardiogram August 2023 HLD HTN DM-2  Donoven Pett was last seen on June 27,2023 at [facility] for Chest Pain => this was a ER follow-up after presenting with A-fib.  Spontaneously converted to sinus rhythm and discharged home on Eliquis and was on Eliquis (had been prescribed DVT  (was increased to A-fib dose 5 mg twice daily).  Noted having chest pain in the setting of A-fib, also noted aortic murmur => Echo and Myoview ordered.  => He went in for the Echo but never had the Myoview  Recent Hospitalizations: none  Seen on April 17, 2022 by Adonis Brook, PA-C for follow-up on hypertension.  Noted that he was on a low-salt diet was not participating in exercise program.  Had no further episodes of A-fib Farxiga till.  Calcitriol antagonist (diltiazem ER 120 mg) and Eliquis.  He voiced being unhappy with the cardiologist that he had been seeing, and asked to be transferred to [facility].   Reviewed  CV studies:    The following studies were reviewed today: (if available, images/films reviewed: From Epic Chart or Care Everywhere) TTE 01/03/2022 ([facility]): EF 55 to 60%.  Apical akinesis.  Moderate aortic stenosis mean gradient 26 mmHg (diffuse thickening).  Moderate LA dilation Myoview ordered but not  done  Interval History:   Dirk Vanaman presents here today with his wife.  He has a strange somewhat nonchalant affect and mood to him.  He both he and his wife spent time joking and laughing about his condition and off topic subjects.  Thankfully, he has been pretty stable over the last several months and was seeking out new primary cardiologist.  Basically the main symptom that it has been described as feeling a sense of fatigue that is usually present most of the time.  He now is also being bothered by right hip pain that is what makes limits his walking.  He is wife usually walk daily around the block, but stairs and he denies any chest pain or pressure.  No exertional dyspnea just does not have a lot of energy.  Easily able to achieve 8 METS without symptoms. No PND, orthopnea or edema.  No real change in baseline COPD related dyspnea.  Since last July, no episodes of rapid irregular heartbeats or palpitations.  No anginal symptoms with rest or exertion.  No melena, hematochezia or hematuria.  No epistaxis.  We talked for quite a time about his diagnosis of A-fib, and the indication for having ordered an echocardiogram and a Myoview stress test.  He was very Concerned about a nuclear stress test because the cold medicines they can give him ".  He said that in the past he was able to do treadmill stress test, however he is pending potential hip replacement surgery he was worried about apparently the [facility] although he has not had it before.  He became quite argumentative normally discussed this.  CV Review of Symptoms (Summary):  no chest pain or dyspnea on exertion positive for - baseline COPD related dyspnea, generalized fatigue with some exercise intolerance negative for - edema, irregular heartbeat, orthopnea, palpitations, paroxysmal nocturnal dyspnea, rapid heart rate, or lightheadedness, dizziness, wooziness or syncope/near syncope, TIA/amaurosis fugax, claudication  REVIEWED OF SYSTEMS    Review of Systems  Constitutional:  Positive for malaise/fatigue (Just feels tired, does not really fully doing things, mild exercise intolerance.). Negative for weight loss.  HENT:  Negative for congestion.   Respiratory:  Positive for cough and shortness of breath (Mild baseline dyspnea).   Gastrointestinal:  Negative for blood in stool and melena.       IBS symptoms-intermittent loose stools and constipation.  Genitourinary:  Negative for hematuria.  Musculoskeletal:  Positive for joint pain (Right knee pain worse with exertion). Negative for falls.  Neurological:  Negative for dizziness, focal weakness and headaches.  Psychiatric/Behavioral:  Negative for depression and memory loss. The patient is nervous/anxious (He does have some underlying anxiety excessive worrying about health and social issues.).     I have reviewed and (if needed) personally updated the patient's problem list, medications, allergies, past medical and surgical history, social and family history.   PAST MEDICAL HISTORY   Past Medical History:  Diagnosis Date   COPD (chronic obstructive pulmonary disease) ([place])    Diabetes mellitus without complication ([place])    Diverticulitis    Hard of hearing    Hypertension    Moderate aortic stenosis by prior echocardiogram 12/2021   Moderate AS-mean gradient 26 mmHg.   PAF (paroxysmal atrial fibrillation) ([place]) 10/2021   Was seen in the ER at [facility] ER.  Came in in A-fib with spontaneously converted rhythm.  Per ER and cardiology's note had chest pain in the setting of A-fib.   Rheumatoid arthritis ([place])    In combination with osteoarthritis   ST elevation (STEMI) myocardial infarction involving left anterior descending coronary artery ([place]) 2013   Isla Pence to LAD    PAST SURGICAL HISTORY   Past Surgical History:  Procedure Laterality Date   CORONARY ANGIOPLASTY WITH STENT PLACEMENT Bilateral 2013   [facility] PCI in setting  of anterior STEMI   HAND SURGERY Right 1986   reconstructive surgery on fingers    HERNIA REPAIR     x2   TRANSTHORACIC ECHOCARDIOGRAM  01/03/2022   [place]): EF 55 to 60%.  Apical akinesis.  Moderate aortic stenosis mean gradient 26 mmHg (diffuse thickening).  Moderate LA dilation     There is no immunization history on file for this patient.  MEDICATIONS/ALLERGIES   Current Meds  Medication Sig   Cholecalciferol (VITAMIN D3 PO) Take by mouth.   diazepam (VALIUM) 5 MG tablet Take 5 mg by mouth in the morning and at bedtime.   fluticasone (FLONASE) 50 MCG/ACT nasal spray Place 2 sprays into both nostrils daily as needed for allergies.    hydrochlorothiazide (HYDRODIURIL) 12.5 MG tablet Take 12.5 mg by mouth daily.    irbesartan (AVAPRO) 150 MG tablet Take 150 mg by mouth daily.   Krill Oil 350 MG CAPS Take 350 mg by mouth daily.    lactulose (CEPHULAC) 10 g packet Take 10 g by mouth 3 (three) times daily.   Multiple Vitamins-Minerals (ZINC PO) Take 1 tablet by mouth daily.   potassium chloride (KLOR-CON) 10 MEQ tablet Take 10 mEq by mouth daily.   pravastatin (PRAVACHOL) 80 MG tablet Take 80 mg by mouth daily.   Selenium 100 MCG TABS Take 100 mcg by mouth.   tamsulosin (FLOMAX) 0.4 MG CAPS capsule Take 0.4 mg by mouth daily as needed (urinary issues.).    terazosin (HYTRIN) 1 MG capsule Take 1 mg by mouth at bedtime.   TRULICITY [id number] SOPN Inject 0.75 mg into the skin every Wednesday.  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   Smoking  when he had his MI in 2013.   Married, 1 child.  Quit smoking in 2013 after 50-pack-year smoking history.  Family History  Problem Relation Age of Onset   Stroke Mother    Mitral valve prolapse Mother    Heart attack Father    Alcoholism Father    Alcoholism Brother    Congestive Heart Failure Brother    Alcoholism Brother    Congestive Heart Failure Brother    Anxiety disorder Daughter    Healthy Daughter    Father and brother-no alcohol.  Brother asthma.  Mother stroke.  Father MI.  OBJCTIVE -PE, EKG, labs   Wt Readings from Last 3 Encounters:  06/05/22 196 lb 12.8 oz (89.3 kg)  07/23/19 187 lb 8 oz (85 kg)  02/18/18 196 lb (88.9 kg)    Physical Exam: BP (!) 144/78   Pulse 68   Ht 5\' 8"  (1.727 m)   Wt 196 lb 12.8 oz (89.3 kg)   SpO2 95%   BMI 29.92 kg/m   Physical Exam Vitals reviewed: Recheck BP 144/78 mmHg. Home BP recordings 116-130/64-80 mmHg.  Constitutional:      General: He is not in acute distress.    Appearance: Normal appearance. He is obese. He is not ill-appearing or toxic-appearing.  HENT:     Head: Normocephalic and atraumatic.  Neck:     Vascular: No carotid bruit.  Cardiovascular:     Rate and Rhythm: Normal rate and regular rhythm. Occasional Extrasystoles are present.    Chest Wall: PMI is not displaced.     Pulses: Normal pulses.     Heart sounds: S1 normal and S2 normal. Heart sounds not distant. Murmur heard.     High-pitched harsh crescendo-decrescendo midsystolic murmur is present with a grade of 2/6 at the upper right sternal border radiating to the neck.     No friction rub. No gallop.  Pulmonary:     Effort: Pulmonary effort is normal. No respiratory distress.     Breath sounds: No wheezing, rhonchi or rales.     Comments: Somewhat distant breath sounds with faint adventitious sounds but no wheezes rales or rhonchi. Chest:     Chest wall: No tenderness.  Musculoskeletal:        General: No swelling (Trivial ankle swelling). Normal  range of motion.     Cervical back: Normal range of motion and neck supple.  Skin:    General: Skin is warm and dry.  Neurological:     General: No focal deficit present.     Mental Status: He is alert and oriented to person, place, and time.     Cranial Nerves: No cranial nerve deficit.     Motor: No weakness.     Gait: Gait abnormal (Walking with a cane-antalgic).  Psychiatric:        Mood and Affect: Mood normal.  Thought Content: Thought content normal.     Comments: Somewhat sarcastic-inappropriate     Adult ECG Report  Rate: 62 ;  Rhythm: normal sinus rhythm and iRBBB, LAFB, ~LVH, ~ Septal MI, age ineterminate ;   Narrative Interpretation: abnormal   Recent Labs:   02/16/2022: TC 180, TG 46, HDL 50, LDL 54.  A1c 5.9%. Cr 0.66, K+ 3.9 Lab Results  Component Value Date   TRIG 69 07/23/2019   Lab Results  Component Value Date   CREATININE 0.61 07/25/2019   BUN 28 (H) 07/25/2019   NA 141 07/25/2019   K 3.6 07/25/2019   CL 105 07/25/2019   CO2 28 07/25/2019      Latest Ref Rng & Units 07/25/2019    3:07 AM 07/24/2019    2:00 AM 07/23/2019    9:48 AM  CBC  WBC 4.0 - 10.5 K/uL 7.0  2.9  3.9   Hemoglobin 13.0 - 17.0 g/dL 12.4  13.1  13.6   Hematocrit 39.0 - 52.0 % 37.0  39.6  40.0   Platelets 150 - 400 K/uL 193  164  148     Lab Results  Component Value Date   HGBA1C 5.7 (H) 07/24/2019   No results found for: "TSH"  ================================================== I spent a total of 59  minutes with the patient spent in direct patient consultation.  Additional time spent with chart review  / charting (studies, outside notes, etc): 27 min Total Time: 86 min  Current medicines are reviewed at length with the patient today.  (+/- concerns) n/a  Notice: This dictation was prepared with Dragon dictation along with smart phrase technology. Any transcriptional errors that result from this process are unintentional and may not be corrected upon  review.   Studies Ordered:  Orders Placed This Encounter  Procedures   EKG 12-Lead   ECHOCARDIOGRAM COMPLETE   No orders of the defined types were placed in this encounter.   Patient Instructions / Medication Changes & Studies & Tests Ordered   Patient Instructions  Medication Instructions:    Continue taking Diltiazem  120 mg unto  2 weeks after your knee surgery , then wean off medication by taking medication every other day for 2 weeks the stop.  After 2 weeks off medication if you notice increase heart rate or you develop Afib then restart medication      *If you need a refill on your cardiac medications before your next appointment, please call your pharmacy*   Lab Work: Not needed    Testing/Procedures:   Will be schedule in May or June 2024 at [place] has requested that you have an echocardiogram. Echocardiography is a painless test that uses sound waves to create images of your heart. It provides your doctor with information about the size and shape of your heart and how well your heart's chambers and valves are working. This procedure takes approximately one hour. There are no restrictions for this procedure. Please do NOT wear cologne, perfume, aftershave, or lotions (deodorant is allowed). Please arrive 15 minutes prior to your appointment time.   Follow-Up: At [facility], you and your health needs are our priority.  As part of our continuing mission to provide you with exceptional heart care, we have created designated Provider Care Teams.  These Care Teams include your primary Cardiologist (physician) and Advanced Practice Providers (APPs -  Physician Assistants and Nurse Practitioners) who all work together to provide you with the care  you need, when you need it.     Your next appointment:   4 to 5 month(s)  The format for your next appointment:   In Person  Provider:   Glenetta Hew, MD    Other  Instructions   You do have clearance for Knee surgery in Feb with Dr Alvan Dame - may stop Eliquis 2- 3 days prior to surgery . Continue taking Diltiazem  120 mg daily .      Leonie Man, MD, MS Glenetta Hew, M.D., M.S. Interventional Cardiologist  [place]  Pager # [phone] Phone # [phone] [address] [place], [place] [address]   Thank you for choosing [facility]!!

## 2022-06-10 ENCOUNTER — Encounter: Payer: Self-pay | Admitting: Cardiology

## 2022-06-10 NOTE — Assessment & Plan Note (Signed)
Echo diagnosed in the setting of A-fib RVR evaluation.  This showed moderate AAS gradient of 26 mmHg.  We spent some time discussing aortic stenosis.  We will order follow-up study for this fall just to confirm stability.  This would not preclude him from having any surgeries.

## 2022-06-10 NOTE — Assessment & Plan Note (Signed)
Blood pressure is high today.  Has been high in the past at PCPs office as well.  He was did not take diltiazem which is reasonable because we want to see if this is may be contributing to fatigue.  However we will need to adjust blood pressure.  Based on the initial interaction that we had today, I did not feel like broaching that topic.  We can consider titrating up his ARB and HCTZ in the future.

## 2022-06-10 NOTE — Assessment & Plan Note (Signed)
11 years out from his MI.  He had an echocardiogram done last year which showed normal EF.  Has not actively had any anginal symptoms or any heart failure symptoms. Did appear to have been an apical infarct on the echocardiogram but preserved EF.

## 2022-06-10 NOTE — Assessment & Plan Note (Addendum)
Far as he can tell, he only that 1 episode was associated with taking decongestant medicines for cold.  I did simply recommend that he avoid decongestants.  We reviewed what medicines are decongestants.  He is currently on diltiazem and Eliquis. He is having fatigue and we can see how he does off the diltiazem, but would like for this to be after he has his knee surgery done.  He can then discontinue diltiazem postop.  After that we will need to further titrate medications for blood pressure weaning probably adding amlodipine.  Not using beta-blocker because of the fatigue issue as well.  Need to confirm if he is taking Eliquis-it was not on his list for [place] to review, but was on the list from his PCP.

## 2022-06-10 NOTE — Assessment & Plan Note (Signed)
With 1 episode of A-fib, he was started on Eliquis and as far as I can tell was still on it when he was seen by his PCP but is not currently listed on his med list. => Recommended continued use of Eliquis.  Eliquis can be held preop for surgeries or procedures.  For hip surgery would probably be beneficial to hold for 48 to 72 hours.  This patients CHA2DS2-VASc Score and unadjusted Ischemic Stroke Rate (% per year) is equal to 7.2 % stroke rate/year from a score of 5  Above score calculated as 1 point each if present [CHF, HTN, DM, Vascular=MI/PAD/Aortic Plaque, Age if 65-74, or Male] Above score calculated as 2 points each if present [Age > 75, or Stroke/TIA/TE]

## 2022-06-10 NOTE — Assessment & Plan Note (Signed)
Lipid panel looks great from last September on pravastatin.  For now he is stable on that dose and tolerating it well.  Last A1c was also pretty well-controlled at 5.9.  He is only on Trulicity

## 2022-06-10 NOTE — Assessment & Plan Note (Addendum)
10 years out from MI with LAD PCI.  Had a recent echocardiogram showed normal preserved EF.  In the absence of active anginal symptoms I will see any reason to proceed with any further stress testing.  He was reluctant to go forward with testing anyway.  Risk factors include diabetes, not on insulin history of CAD but no active symptoms.  Surgery is low risk from a cardiac standpoint.    As such, he will be at least low to intermediate risk for upcoming knee surgery.  I would like for him to continue his diltiazem at least daily as had his knee surgery.

## 2022-06-10 NOTE — Assessment & Plan Note (Addendum)
History of anterior STEMI with likely LAD PCI.  Unfortunately he does not have any data on this.  We will try to get records from [place], [place]. Thankfully he does not seem to be having any anginal symptoms with the amount of activity he is able to do.  No chest pain or pressure no exertional dyspnea.  All the only symptom he notices is fatigue.  With no active symptoms and at least 8 METS, I do not think we actually need to stress test at this point especially with plan for potential knee surgery is coming up.  He has not been taking the diltiazem routinely but I would like for her to go and take that at least until his surgery.  Will then potentially consider holding it postop and see if how that helps his fatigue.  He remains on aspirin along with his high-dose pravastatin.  Not currently taking Eliquis.  Need to confirm if he is or is taking Eliquis.  If he is, we could have him stop aspirin.Marland Kitchen

## 2022-06-12 ENCOUNTER — Telehealth: Payer: Self-pay | Admitting: Cardiology

## 2022-06-12 NOTE — Telephone Encounter (Signed)
Patient states he has been receiving emails about checking his mychart for his test results. He says his computer is down and would like a call about the results.

## 2022-06-12 NOTE — Telephone Encounter (Signed)
Patient stated he got messages on MyChart regarding results. Upon reviewing patient's chart, there are no current results to provide. Patient is scheduled on 1/25 for orthopedic surgery and stated DR. Ellyn Hack has cleared him. No there concerns voiced.

## 2022-06-20 DIAGNOSIS — I2510 Atherosclerotic heart disease of native coronary artery without angina pectoris: Secondary | ICD-10-CM | POA: Diagnosis not present

## 2022-06-20 DIAGNOSIS — I10 Essential (primary) hypertension: Secondary | ICD-10-CM | POA: Diagnosis not present

## 2022-06-20 DIAGNOSIS — Z6828 Body mass index (BMI) 28.0-28.9, adult: Secondary | ICD-10-CM | POA: Diagnosis not present

## 2022-06-20 DIAGNOSIS — F419 Anxiety disorder, unspecified: Secondary | ICD-10-CM | POA: Diagnosis not present

## 2022-06-20 DIAGNOSIS — Z79899 Other long term (current) drug therapy: Secondary | ICD-10-CM | POA: Diagnosis not present

## 2022-06-20 DIAGNOSIS — E1169 Type 2 diabetes mellitus with other specified complication: Secondary | ICD-10-CM | POA: Diagnosis not present

## 2022-06-20 DIAGNOSIS — E118 Type 2 diabetes mellitus with unspecified complications: Secondary | ICD-10-CM | POA: Diagnosis not present

## 2022-06-20 DIAGNOSIS — E785 Hyperlipidemia, unspecified: Secondary | ICD-10-CM | POA: Diagnosis not present

## 2022-06-23 ENCOUNTER — Telehealth: Payer: Self-pay | Admitting: Cardiology

## 2022-06-23 NOTE — Telephone Encounter (Signed)
*  STAT* If patient is at the pharmacy, call can be transferred to refill team.   1. Which medications need to be refilled? (please list name of each medication and dose if known)  Eliquis  diltiazem (DILACOR XR) 120 MG 24 hr capsule  2. Which pharmacy/location (including street and city if local pharmacy) is medication to be sent to? [place]  3. Do they need a 30 day or 90 day supply?  90 day supply

## 2022-06-27 MED ORDER — DILTIAZEM HCL ER 120 MG PO CP24: 120.0000 mg | ORAL_CAPSULE | Freq: Every day | ORAL | 0 refills | Status: DC

## 2022-06-29 ENCOUNTER — Telehealth: Payer: Self-pay | Admitting: Cardiology

## 2022-06-29 NOTE — Telephone Encounter (Signed)
Called pt, he states he is taking Eliquis. "I have 5 pills left. I was told I should not stop taking this. So What should I do?" Pt made aware Eliquis is not on his list and from Dr. Allison Quarry note I'm not sure if he is to continue the medication or not. Told pt I have to get this message to Dr. Ellyn Hack for review before a refill can be sent in. He verbalized understanding.

## 2022-06-29 NOTE — Telephone Encounter (Signed)
Pt c/o medication issue:  1. Name of Medication: Elliquis   2. How are you currently taking this medication (dosage and times per day)?    3. Are you having a reaction (difficulty breathing--STAT)? no  4. What is your medication issue? Patient calling to get a refill on elliquis but its not on his current medication. Please advise

## 2022-06-30 MED ORDER — APIXABAN 5 MG PO TABS: 5.0000 mg | ORAL_TABLET | Freq: Two times a day (BID) | ORAL | 2 refills | Status: DC

## 2022-06-30 NOTE — Telephone Encounter (Signed)
Patient is taking Eliquis 5 mg  twice a day. Stated in last office visit  per Dr Ellyn Hack.   New prescription sent to pharmacy as patient requested  90 day supply.

## 2022-07-04 NOTE — Patient Instructions (Signed)
SURGICAL WAITING ROOM VISITATION Patients having surgery or a procedure may have no more than 2 support people in the waiting area - these visitors may rotate in the visitor waiting room.   Due to an increase in RSV and influenza rates and associated hospitalizations, children ages 68 and under may not visit patients in [place]. If the patient needs to stay at the hospital during part of their recovery, the visitor guidelines for inpatient rooms apply.  PRE-OP VISITATION  Pre-op nurse will coordinate an appropriate time for 1 support person to accompany the patient in pre-op.  This support person may not rotate.  This visitor will be contacted when the time is appropriate for the visitor to come back in the pre-op area.  Please refer to the [facility] website for the visitor guidelines for Inpatients (after your surgery is over and you are in a regular room).  You are not required to quarantine at this time prior to your surgery. However, you must do this: Hand Hygiene often Do NOT share personal items Notify your provider if you are in close contact with someone who has COVID or you develop fever 100.4 or greater, new onset of sneezing, cough, sore throat, shortness of breath or body aches.  If you test positive for Covid or have been in contact with anyone that has tested positive in the last 10 days please notify you surgeon.    Your procedure is scheduled on: Tuesday  July 11, 2022   Report to [facility] Main Entrance: [place] entrance where the [employer] is available.   Report to admitting at: 07:30  AM  +++++Call this number if you have any questions or problems the morning of surgery [phone]  Do not eat food after Midnight the night prior to your surgery/procedure.  After Midnight you may have the following liquids until  07:00  AM DAY OF SURGERY  Clear Liquid Diet Water Black Coffee (sugar ok, NO MILK/CREAM OR CREAMERS)  Tea (sugar ok, NO  MILK/CREAM OR CREAMERS) regular and decaf                             Plain Jell-O  with no fruit (NO RED)                                           Fruit ices (not with fruit pulp, NO RED)                                     Popsicles (NO RED)                                                                  Juice: apple, WHITE grape, WHITE cranberry Sports drinks like Gatorade or Powerade (NO RED)                     The day of surgery:  Drink ONE (1) Pre-Surgery G2 at  07:00  AM the morning of surgery. Drink in one sitting.  Do not sip.  This drink was given to you during your hospital pre-op appointment visit. Nothing else to drink after completing the Pre-Surgery G2 : No candy, chewing gum or throat lozenges.    FOLLOW  ANY ADDITIONAL PRE OP INSTRUCTIONS YOU RECEIVED FROM YOUR SURGEON'S OFFICE!!!   Oral Hygiene is also important to reduce your risk of infection.        Remember - BRUSH YOUR TEETH THE MORNING OF SURGERY WITH YOUR REGULAR TOOTHPASTE  Do NOT smoke after Midnight the night before surgery.  DIABETIC MEDICATION: Trulicity Injection (Wednesday): Last injection: 06-28-22  ?prior to 07-05-22              Hold 7 days before surgery then resume the week after your surgery (07-19-22)  Take ONLY these medicines the morning of surgery with A SIP OF WATER: Diltiazem XR. You may take Diazepam (Valium) and omeprazole (Prilosec) if needed. You may use your Flonase nasal spray.    If You have been diagnosed with Sleep Apnea - Bring CPAP mask and tubing day of surgery. We will provide you with a CPAP machine on the day of your surgery.                   You may not have any metal on your body including  jewelry, and body piercing  Do not wear  lotions, powders, cologne, or deodorant  Men may shave face and neck.  Contacts, Hearing Aids, dentures or bridgework may not be worn into surgery. DENTURES WILL BE REMOVED PRIOR TO SURGERY PLEASE DO NOT APPLY "Poly grip" OR  ADHESIVES!!!  You may bring a small overnight bag with you on the day of surgery, only pack items that are not valuable. [place] [facility] IS NOT RESPONSIBLE   FOR VALUABLES THAT ARE LOST OR STOLEN.   Do not bring your home medications to the hospital. The Pharmacy will dispense medications listed on your medication list to you during your admission in the Hospital.  Special Instructions: Bring a copy of your healthcare power of attorney and living will documents the day of surgery, if you wish to have them scanned into your [facility] Records- EPIC  Please read over the following fact sheets you were given: IF YOU HAVE QUESTIONS ABOUT YOUR PRE-OP INSTRUCTIONS, PLEASE CALL [phone]  (KAY)   [place] - Preparing for Surgery Before surgery, you can play an important role.  Because skin is not sterile, your skin needs to be as free of germs as possible.  You can reduce the number of germs on your skin by washing with CHG (chlorahexidine gluconate) soap before surgery.  CHG is an antiseptic cleaner which kills germs and bonds with the skin to continue killing germs even after washing. Please DO NOT use if you have an allergy to CHG or antibacterial soaps.  If your skin becomes reddened/irritated stop using the CHG and inform your nurse when you arrive at Short Stay. Do not shave (including legs and underarms) for at least 48 hours prior to the first CHG shower.  You may shave your face/neck.  Please follow these instructions carefully:  1.  Shower with CHG Soap the night before surgery and the  morning of surgery.  2.  If you choose to wash your hair, wash your hair first as usual with your normal  shampoo.  3.  After you shampoo, rinse your hair and body thoroughly to remove the shampoo.  4.  Use CHG as you would any other liquid soap.  You can apply chg directly to the skin and wash.  Gently with a scrungie or clean washcloth.  5.  Apply the CHG Soap to your  body ONLY FROM THE NECK DOWN.   Do not use on face/ open                           Wound or open sores. Avoid contact with eyes, ears mouth and genitals (private parts).                       Wash face,  Genitals (private parts) with your normal soap.             6.  Wash thoroughly, paying special attention to the area where your  surgery  will be performed.  7.  Thoroughly rinse your body with warm water from the neck down.  8.  DO NOT shower/wash with your normal soap after using and rinsing off the CHG Soap.            9.  Pat yourself dry with a clean towel.            10.  Wear clean pajamas.            11.  Place clean sheets on your bed the night of your first shower and do not  sleep with pets.  ON THE DAY OF SURGERY : Do not apply any lotions/deodorants the morning of surgery.  Please wear clean clothes to the hospital/surgery center.    FAILURE TO FOLLOW THESE INSTRUCTIONS MAY RESULT IN THE CANCELLATION OF YOUR SURGERY  PATIENT SIGNATURE_________________________________  NURSE SIGNATURE__________________________________  ________________________________________________________________________       Adam Phenix    An incentive spirometer is a tool that can help keep your lungs clear and active. This tool measures how well you are filling your lungs with each breath. Taking long deep breaths may help reverse or decrease the chance of developing breathing (pulmonary) problems (especially infection) following: A long period of time when you are unable to move or be active. BEFORE THE PROCEDURE  If the spirometer includes an indicator to show your best effort, your nurse or respiratory therapist will set it to a desired goal. If possible, sit up straight or lean slightly forward. Try not to slouch. Hold the incentive spirometer in an upright position. INSTRUCTIONS FOR USE  Sit on the edge of your bed if possible, or sit up as far as you can in bed or on a  chair. Hold the incentive spirometer in an upright position. Breathe out normally. Place the mouthpiece in your mouth and seal your lips tightly around it. Breathe in slowly and as deeply as possible, raising the piston or the ball toward the top of the column. Hold your breath for 3-5 seconds or for as long as possible. Allow the piston or ball to fall to the bottom of the column. Remove the mouthpiece from your mouth and breathe out normally. Rest for a few seconds and repeat Steps 1 through 7 at least 10 times every 1-2 hours when you are awake. Take your time and take a few normal breaths between deep breaths. The spirometer may include an indicator to show your best effort. Use the indicator as a goal to work toward during each repetition. After each set of 10 deep breaths, practice coughing to be  sure your lungs are clear. If you have an incision (the cut made at the time of surgery), support your incision when coughing by placing a pillow or rolled up towels firmly against it. Once you are able to get out of bed, walk around indoors and cough well. You may stop using the incentive spirometer when instructed by your caregiver.  RISKS AND COMPLICATIONS Take your time so you do not get dizzy or light-headed. If you are in pain, you may need to take or ask for pain medication before doing incentive spirometry. It is harder to take a deep breath if you are having pain. AFTER USE Rest and breathe slowly and easily. It can be helpful to keep track of a log of your progress. Your caregiver can provide you with a simple table to help with this. If you are using the spirometer at home, follow these instructions: [place] IF:  You are having difficultly using the spirometer. You have trouble using the spirometer as often as instructed. Your pain medication is not giving enough relief while using the spirometer. You develop fever of 100.5 F (38.1 C) or higher.                                                                                                     SEEK IMMEDIATE MEDICAL CARE IF:  You cough up bloody sputum that had not been present before. You develop fever of 102 F (38.9 C) or greater. You develop worsening pain at or near the incision site. MAKE SURE YOU:  Understand these instructions. Will watch your condition. Will get help right away if you are not doing well or get worse. Document Released: 09/25/2006 Document Revised: 08/07/2011 Document Reviewed: 11/26/2006 [facility] Patient Information 2014 [place], [place].

## 2022-07-04 NOTE — Progress Notes (Signed)
Patient is Highly allergic to IVP dye, however He states that he can use Betadine on his skin. He is not allergic to shellfish.   COVID Vaccine received:  [x]  No []  Yes Date of any COVID positive Test in last 90 days: None  PCP - Janine Limbo, PA-C Medical clearance on chart Cardiologist - Glenetta Hew, MD Cardiac clearance on chart and preop note 06-05-2022 in Epic  Chest x-ray - 07-23-2019  epic EKG - 06-05-2022  epic  Stress Test - refused to have.  ECHO - Had TTE 01-03-2022 at [facility].  Dr. Ellyn Hack has one scheduled for May 2024 Cardiac Cath - 2013 had STEMI, cath done in [place], [place]  PCR screen: [x]  Ordered & Completed                      []   No Order but Needs PROFEND                      []   N/A for this surgery  Surgery Plan:  []  Ambulatory                            [x]  Outpatient in bed                            []  Admit  Anesthesia:    []  General  [x]  Spinal                           []   Choice []   MAC   Pacemaker / ICD device [x]  No []  Yes        Device order form faxed [x]  No    []   Yes      Faxed to:  Spinal Cord Stimulator:[x]  No []  Yes      (Remind patient to bring remote DOS) Other Implants:   History of Sleep Apnea? [x]  No []  Yes   CPAP used?- [x]  No []  Yes    Does the patient monitor blood sugar? []  No [x]  Yes  []  N/A LAST A1c: 07-24-2019 ? 5.7 Patient has: [x]  Pre-DM   []  DM1  []   DM2 Does patient have a [employer] or Dexacom? [x]  No []  Yes   Fasting Blood Sugar Ranges- 105-160 Checks Blood Sugar __1__ times a day  Last dose of GLP1 agonist- Trulicity on Wednesday   Last injection: 06-28-22 GLP1 instructions: Hold 7 days before surgery then resume the week after your surgery (07-19-22)  Last dose of SGLT-2 inhibitors-  SGLT-2 instructions:  Other Diabetic medications/ instructions: none  Blood Thinner / Instructions: Eliquis Ok to hold 2-3 days per Dr. Ellyn Hack' clearance letter on chart Aspirin Instructions:  ERAS Protocol  Ordered: []  No  [x]  Yes PRE-SURGERY []  ENSURE  [x]  G2  Patient is to be NPO after: 07:00 am  Comments: Patient is VERY HOH and wears HAs.  Activity level: Patient can not climb a flight of stairs without difficulty; would have SOB,    Patient can perform ADLs without assistance.   Anesthesia review: Pre-DM, A.fib, CAD-STEMI (Cath PCI 2013 in [place]), Moderate AS- murmur, HOH, RA.  Patient denies shortness of breath, fever, cough and chest pain at PAT appointment.  Patient verbalized understanding and agreement to the Pre-Surgical Instructions that were given to them at this PAT appointment. Patient was also educated of the  need to review these PAT instructions again prior to his/her surgery.I reviewed the appropriate phone numbers to call if they have any and questions or concerns.

## 2022-07-05 ENCOUNTER — Encounter (HOSPITAL_COMMUNITY): Payer: Self-pay

## 2022-07-05 ENCOUNTER — Encounter (HOSPITAL_COMMUNITY)
Admission: RE | Admit: 2022-07-05 | Discharge: 2022-07-05 | Disposition: A | Discharge status: Home / Self Care | Payer: Medicare PPO | Source: Ambulatory Visit | Attending: Orthopedic Surgery | Admitting: Orthopedic Surgery

## 2022-07-05 ENCOUNTER — Other Ambulatory Visit: Payer: Self-pay

## 2022-07-05 VITALS — BP 101/87 | HR 77 | Temp 98.0°F | Resp 16 | Ht 68.0 in | Wt 190.0 lb

## 2022-07-05 DIAGNOSIS — I252 Old myocardial infarction: Secondary | ICD-10-CM | POA: Insufficient documentation

## 2022-07-05 DIAGNOSIS — I10 Essential (primary) hypertension: Secondary | ICD-10-CM | POA: Insufficient documentation

## 2022-07-05 DIAGNOSIS — E119 Type 2 diabetes mellitus without complications: Secondary | ICD-10-CM | POA: Diagnosis not present

## 2022-07-05 DIAGNOSIS — M1711 Unilateral primary osteoarthritis, right knee: Secondary | ICD-10-CM | POA: Diagnosis not present

## 2022-07-05 DIAGNOSIS — Z01812 Encounter for preprocedural laboratory examination: Secondary | ICD-10-CM | POA: Diagnosis not present

## 2022-07-05 DIAGNOSIS — I2510 Atherosclerotic heart disease of native coronary artery without angina pectoris: Secondary | ICD-10-CM | POA: Diagnosis not present

## 2022-07-05 DIAGNOSIS — J449 Chronic obstructive pulmonary disease, unspecified: Secondary | ICD-10-CM | POA: Diagnosis not present

## 2022-07-05 DIAGNOSIS — Z955 Presence of coronary angioplasty implant and graft: Secondary | ICD-10-CM | POA: Insufficient documentation

## 2022-07-05 DIAGNOSIS — Z87891 Personal history of nicotine dependence: Secondary | ICD-10-CM | POA: Diagnosis not present

## 2022-07-05 DIAGNOSIS — I480 Paroxysmal atrial fibrillation: Secondary | ICD-10-CM | POA: Diagnosis not present

## 2022-07-05 DIAGNOSIS — M25561 Pain in right knee: Secondary | ICD-10-CM | POA: Diagnosis not present

## 2022-07-05 DIAGNOSIS — Z01818 Encounter for other preprocedural examination: Secondary | ICD-10-CM

## 2022-07-05 DIAGNOSIS — R7303 Prediabetes: Secondary | ICD-10-CM

## 2022-07-05 HISTORY — DX: Pneumonia, unspecified organism: J18.9

## 2022-07-05 HISTORY — DX: Fibromyalgia: M79.7

## 2022-07-05 LAB — BASIC METABOLIC PANEL
Anion gap: 10 (ref 5–15)
BUN: 15 mg/dL (ref 8–23)
CO2: 26 mmol/L (ref 22–32)
Calcium: 8.9 mg/dL (ref 8.9–10.3)
Chloride: 103 mmol/L (ref 98–111)
Creatinine, Ser: 0.73 mg/dL (ref 0.61–1.24)
GFR, Estimated: >60 mL/min (ref >60)
Glucose, Bld: 114 mg/dL — ABNORMAL HIGH (ref 70–99)
Potassium: 3.6 mmol/L (ref 3.5–5.1)
Sodium: 139 mmol/L (ref 135–145)

## 2022-07-05 LAB — CBC
HCT: 36.7 % — ABNORMAL LOW (ref 39.0–52.0)
Hemoglobin: 11.4 g/dL — ABNORMAL LOW (ref 13.0–17.0)
MCH: 23.5 pg — ABNORMAL LOW (ref 26.0–34.0)
MCHC: 31.1 g/dL (ref 30.0–36.0)
MCV: 75.7 fL — ABNORMAL LOW (ref 80.0–100.0)
Platelets: 155 10*3/uL (ref 150–400)
RBC: 4.85 MIL/uL (ref 4.22–5.81)
RDW: 16.3 % — ABNORMAL HIGH (ref 11.5–15.5)
WBC: 5.7 10*3/uL (ref 4.0–10.5)
nRBC: 0 % (ref 0.0–0.2)

## 2022-07-05 LAB — GLUCOSE, CAPILLARY: Glucose-Capillary: 108 mg/dL — ABNORMAL HIGH (ref 70–99)

## 2022-07-05 LAB — HEMOGLOBIN A1C
Hgb A1c MFr Bld: 5.7 % — ABNORMAL HIGH (ref 4.8–5.6)
Mean Plasma Glucose: 116.89 mg/dL

## 2022-07-05 LAB — SURGICAL PCR SCREEN
MRSA, PCR: NEGATIVE
Staphylococcus aureus: NEGATIVE

## 2022-07-06 NOTE — Progress Notes (Addendum)
Anesthesia Chart Review   Case: Patrick Klein Date/Time: 07/11/22 0945   Procedure: TOTAL KNEE ARTHROPLASTY (Right: Knee) - 90   Anesthesia type: Spinal   Pre-op diagnosis: Right knee osteoarthritis   Location: [facility] ROOM 09 / [facility]   Surgeons: Paralee Cancel, MD       DISCUSSION:77 y.o. former smoker with h/o HTN, DM II, COPD, CAD (LAD PCI)PAF, moderate AS mean gradient 26 mmHg,, right knee OA scheduled for above procedure 07/11/2022 with Dr. Paralee Cancel.   Pt seen by cardiology 06/10/2022. Per OV note, "Echo diagnosed in the setting of A-fib RVR evaluation.  This showed moderate AAS gradient of 26 mmHg.  We spent some time discussing aortic stenosis.  We will order follow-up study for this fall just to confirm stability.   This would not preclude him from having any surgeries.  10 years out from MI with LAD PCI.  Had a recent echocardiogram showed normal preserved EF.  In the absence of active anginal symptoms I will see any reason to proceed with any further stress testing.  He was reluctant to go forward with testing anyway. Risk factors include diabetes, not on insulin history of CAD but no active symptoms.  Surgery is low risk from a cardiac standpoint.   As such, he will be at least low to intermediate risk for upcoming knee surgery.  I would like for him to continue his diltiazem at least daily as had his knee surgery.  Eliquis can be held preop for surgeries or procedures. For hip surgery would probably be beneficial to hold for 48 to 72 hours. "  Pt reports last dose of Eliquis will be evening of 07/07/2022.   Anticipate pt can proceed with planned procedure barring acute status change.   VS: BP 101/87 Comment: Right arm sitting  Pulse 77   Temp 36.7 C (Oral)   Resp 16   Ht 5' 8"$  (1.727 m)   Wt 86.2 kg   SpO2 97%   BMI 28.89 kg/m   PROVIDERS: O'Buch, Alvis Lemmings, PA-C is PCP   Cardiologist - Glenetta Hew, MD  LABS: Labs reviewed: Acceptable for surgery. (all labs ordered are  listed, but only abnormal results are displayed)  Labs Reviewed  BASIC METABOLIC PANEL - Abnormal; Notable for the following components:      Result Value   Glucose, Bld 114 (*)    All other components within normal limits  CBC - Abnormal; Notable for the following components:   Hemoglobin 11.4 (*)    HCT 36.7 (*)    MCV 75.7 (*)    MCH 23.5 (*)    RDW 16.3 (*)    All other components within normal limits  HEMOGLOBIN A1C - Abnormal; Notable for the following components:   Hgb A1c MFr Bld 5.7 (*)    All other components within normal limits  GLUCOSE, CAPILLARY - Abnormal; Notable for the following components:   Glucose-Capillary 108 (*)    All other components within normal limits  SURGICAL PCR SCREEN     IMAGES:   EKG:   CV: Echo 01/03/2022 SUMMARY  Global LV systolic function is preserved  There is apical LV wall akinesis  LV ejection fraction = 55-60%.  Diffuse thickening of the aortic valve with restricted cusp opening.  There is moderate aortic stenosis.  Mean pressure gradient of 58mHg.  There is no comparison study available.  Past Medical History:  Diagnosis Date   COPD (chronic obstructive pulmonary disease) (HCC)    Diabetes mellitus without  complication (HCC)    Diverticulitis    Fibromyalgia    Hard of hearing    Hypertension    Moderate aortic stenosis by prior echocardiogram 12/2021   Moderate AS-mean gradient 26 mmHg.   PAF (paroxysmal atrial fibrillation) ([place]) 10/2021   Was seen in the ER at [facility] ER.  Came in in A-fib with spontaneously converted rhythm.  Per ER and cardiology's note had chest pain in the setting of A-fib.   Pneumonia    Rheumatoid arthritis ([place])    In combination with osteoarthritis   ST elevation (STEMI) myocardial infarction involving left anterior descending coronary artery [facility]) 2013   Isla Pence to LAD    Past Surgical History:  Procedure Laterality Date   CORONARY ANGIOPLASTY WITH STENT  PLACEMENT Bilateral 2013   [facility] PCI in setting of anterior STEMI   HAND SURGERY Right 1986   reconstructive surgery on fingers    HERNIA REPAIR     x2   TRANSTHORACIC ECHOCARDIOGRAM  01/03/2022   [place]): EF 55 to 60%.  Apical akinesis.  Moderate aortic stenosis mean gradient 26 mmHg (diffuse thickening).  Moderate LA dilation    MEDICATIONS:  apixaban (ELIQUIS) 5 MG TABS tablet   Cholecalciferol (VITAMIN D3 PO)   diazepam (VALIUM) 5 MG tablet   diltiazem (DILACOR XR) 120 MG 24 hr capsule   fluticasone (FLONASE) 50 MCG/ACT nasal spray   hydrochlorothiazide (HYDRODIURIL) 12.5 MG tablet   irbesartan (AVAPRO) 150 MG tablet   Krill Oil 350 MG CAPS   lactulose (CHRONULAC) 10 GM/15ML solution   Multiple Vitamins-Minerals (ZINC PO)   nitroGLYCERIN (NITROSTAT) 0.4 MG SL tablet   omeprazole (PRILOSEC) 40 MG capsule   potassium chloride (KLOR-CON) 10 MEQ tablet   pravastatin (PRAVACHOL) 80 MG tablet   Selenium 100 MCG TABS   terazosin (HYTRIN) 1 MG capsule   TRULICITY [id number] SOPN   No current facility-administered medications for this encounter.     Konrad Felix Ward, PA-C [facility] Pre-Surgical Testing [phone]

## 2022-07-07 NOTE — Anesthesia Preprocedure Evaluation (Addendum)
Anesthesia Evaluation  Patient identified by MRN, date of birth, ID band Patient awake    Reviewed: Allergy & Precautions, NPO status , Patient's Chart, lab work & pertinent test results  Airway Mallampati: III  TM Distance: >3 FB Neck ROM: Full    Dental  (+) Edentulous Upper, Edentulous Lower   Pulmonary COPD, former smoker   Pulmonary exam normal        Cardiovascular hypertension, Pt. on medications + CAD and + Past MI  + dysrhythmias + Valvular Problems/Murmurs AS  Rhythm:Regular Rate:Normal + Systolic murmurs    Neuro/Psych  Neuromuscular disease  negative psych ROS   GI/Hepatic negative GI ROS, Neg liver ROS,,,  Endo/Other  diabetes    Renal/GU negative Renal ROS     Musculoskeletal  (+) Arthritis ,  Fibromyalgia -  Abdominal   Peds  Hematology  (+) Blood dyscrasia (On Eliquis), anemia   Anesthesia Other Findings Right knee osteoarthritis  Reproductive/Obstetrics                             Anesthesia Physical Anesthesia Plan  ASA: 3  Anesthesia Plan: General and Regional   Post-op Pain Management: Regional block*   Induction: Intravenous  PONV Risk Score and Plan: 2 and Ondansetron, Dexamethasone and Treatment may vary due to age or medical condition  Airway Management Planned: LMA  Additional Equipment:   Intra-op Plan:   Post-operative Plan: Extubation in OR  Informed Consent: I have reviewed the patients History and Physical, chart, labs and discussed the procedure including the risks, benefits and alternatives for the proposed anesthesia with the patient or authorized representative who has indicated his/her understanding and acceptance.     Dental advisory given  Plan Discussed with: CRNA  Anesthesia Plan Comments: (PAT note 07/05/2022)       Anesthesia Quick Evaluation

## 2022-07-11 ENCOUNTER — Ambulatory Visit (HOSPITAL_COMMUNITY): Payer: Medicare PPO | Admitting: Physician Assistant

## 2022-07-11 ENCOUNTER — Encounter (HOSPITAL_COMMUNITY): Payer: Self-pay | Admitting: Orthopedic Surgery

## 2022-07-11 ENCOUNTER — Other Ambulatory Visit: Payer: Self-pay

## 2022-07-11 ENCOUNTER — Encounter (HOSPITAL_COMMUNITY)
Admission: RE | Disposition: A | Discharge status: Home / Self Care | Payer: Self-pay | Source: Ambulatory Visit | Attending: Orthopedic Surgery

## 2022-07-11 ENCOUNTER — Observation Stay (HOSPITAL_COMMUNITY)
Admission: RE | Admit: 2022-07-11 | Discharge: 2022-07-12 | Disposition: A | Discharge status: Home / Self Care | Payer: Medicare PPO | Source: Ambulatory Visit | Attending: Orthopedic Surgery | Admitting: Orthopedic Surgery

## 2022-07-11 ENCOUNTER — Ambulatory Visit (HOSPITAL_BASED_OUTPATIENT_CLINIC_OR_DEPARTMENT_OTHER): Payer: Medicare PPO | Admitting: Anesthesiology

## 2022-07-11 DIAGNOSIS — M25761 Osteophyte, right knee: Secondary | ICD-10-CM | POA: Diagnosis not present

## 2022-07-11 DIAGNOSIS — Z955 Presence of coronary angioplasty implant and graft: Secondary | ICD-10-CM | POA: Diagnosis not present

## 2022-07-11 DIAGNOSIS — J449 Chronic obstructive pulmonary disease, unspecified: Secondary | ICD-10-CM | POA: Insufficient documentation

## 2022-07-11 DIAGNOSIS — R7303 Prediabetes: Secondary | ICD-10-CM

## 2022-07-11 DIAGNOSIS — I480 Paroxysmal atrial fibrillation: Secondary | ICD-10-CM | POA: Diagnosis not present

## 2022-07-11 DIAGNOSIS — E119 Type 2 diabetes mellitus without complications: Secondary | ICD-10-CM | POA: Insufficient documentation

## 2022-07-11 DIAGNOSIS — M25461 Effusion, right knee: Secondary | ICD-10-CM

## 2022-07-11 DIAGNOSIS — Z87891 Personal history of nicotine dependence: Secondary | ICD-10-CM | POA: Diagnosis not present

## 2022-07-11 DIAGNOSIS — M1711 Unilateral primary osteoarthritis, right knee: Principal | ICD-10-CM | POA: Insufficient documentation

## 2022-07-11 DIAGNOSIS — I2510 Atherosclerotic heart disease of native coronary artery without angina pectoris: Secondary | ICD-10-CM | POA: Diagnosis not present

## 2022-07-11 DIAGNOSIS — Z96651 Presence of right artificial knee joint: Secondary | ICD-10-CM

## 2022-07-11 DIAGNOSIS — Z8616 Personal history of COVID-19: Secondary | ICD-10-CM | POA: Diagnosis not present

## 2022-07-11 DIAGNOSIS — I10 Essential (primary) hypertension: Secondary | ICD-10-CM | POA: Diagnosis not present

## 2022-07-11 DIAGNOSIS — M659 Synovitis and tenosynovitis, unspecified: Secondary | ICD-10-CM | POA: Diagnosis not present

## 2022-07-11 DIAGNOSIS — I252 Old myocardial infarction: Secondary | ICD-10-CM | POA: Diagnosis not present

## 2022-07-11 DIAGNOSIS — G8918 Other acute postprocedural pain: Secondary | ICD-10-CM | POA: Diagnosis not present

## 2022-07-11 DIAGNOSIS — E1149 Type 2 diabetes mellitus with other diabetic neurological complication: Secondary | ICD-10-CM | POA: Diagnosis not present

## 2022-07-11 HISTORY — PX: TOTAL KNEE ARTHROPLASTY: SHX125

## 2022-07-11 LAB — GLUCOSE, CAPILLARY
Glucose-Capillary: 125 mg/dL — ABNORMAL HIGH (ref 70–99)
Glucose-Capillary: 224 mg/dL — ABNORMAL HIGH (ref 70–99)
Glucose-Capillary: 220 mg/dL — ABNORMAL HIGH (ref 70–99)

## 2022-07-11 SURGERY — ARTHROPLASTY, KNEE, TOTAL
Anesthesia: Regional | Site: Knee | Laterality: Right

## 2022-07-11 MED ORDER — PHENOL 1.4 % MT LIQD: 1.0000 | OROMUCOSAL | Status: DC | PRN

## 2022-07-11 MED ORDER — AMISULPRIDE (ANTIEMETIC) 5 MG/2ML IV SOLN: 10.0000 mg | Freq: Once | INTRAVENOUS | Status: AC | PRN

## 2022-07-11 MED ORDER — KETOROLAC TROMETHAMINE 30 MG/ML IJ SOLN
INTRAMUSCULAR | Status: DC | PRN
  Administered 2022-07-11: 30 mg via INTRAMUSCULAR

## 2022-07-11 MED ORDER — SODIUM CHLORIDE 0.9 % IR SOLN
Status: DC | PRN
  Administered 2022-07-11: 1000 mL

## 2022-07-11 MED ORDER — PROPOFOL 10 MG/ML IV BOLUS
INTRAVENOUS | Status: DC | PRN
  Administered 2022-07-11: 110 mg via INTRAVENOUS
  Administered 2022-07-11: 100 mg via INTRAVENOUS

## 2022-07-11 MED ORDER — LACTATED RINGERS IV SOLN: INTRAVENOUS | Status: DC

## 2022-07-11 MED ORDER — ONDANSETRON HCL 4 MG/2ML IJ SOLN
4.0000 mg | Freq: Four times a day (QID) | INTRAMUSCULAR | Status: DC | PRN
  Administered 2022-07-12: 4 mg via INTRAVENOUS
  Filled 2022-07-11: qty 2

## 2022-07-11 MED ORDER — FENTANYL CITRATE (PF) 100 MCG/2ML IJ SOLN
INTRAMUSCULAR | Status: DC | PRN
  Administered 2022-07-11 (×2): 50 ug via INTRAVENOUS

## 2022-07-11 MED ORDER — DILTIAZEM HCL ER COATED BEADS 120 MG PO CP24
120.0000 mg | ORAL_CAPSULE | Freq: Every day | ORAL | Status: DC
  Administered 2022-07-12: 120 mg via ORAL
  Filled 2022-07-11 (×2): qty 1

## 2022-07-11 MED ORDER — LIDOCAINE 2% (20 MG/ML) 5 ML SYRINGE
INTRAMUSCULAR | Status: DC | PRN
  Administered 2022-07-11: 100 mg via INTRAVENOUS

## 2022-07-11 MED ORDER — ONDANSETRON HCL 4 MG/2ML IJ SOLN: 4.0000 mg | Freq: Once | INTRAMUSCULAR | Status: DC | PRN

## 2022-07-11 MED ORDER — ONDANSETRON HCL 4 MG/2ML IJ SOLN
INTRAMUSCULAR | Status: DC | PRN
  Administered 2022-07-11: 4 mg via INTRAVENOUS

## 2022-07-11 MED ORDER — EPHEDRINE SULFATE-NACL 50-0.9 MG/10ML-% IV SOSY
PREFILLED_SYRINGE | INTRAVENOUS | Status: DC | PRN
  Administered 2022-07-11: 5 mg via INTRAVENOUS
  Administered 2022-07-11 (×2): 10 mg via INTRAVENOUS

## 2022-07-11 MED ORDER — OXYCODONE HCL 5 MG PO TABS
5.0000 mg | ORAL_TABLET | ORAL | Status: DC | PRN
  Administered 2022-07-11: 5 mg via ORAL
  Administered 2022-07-11: 10 mg via ORAL
  Administered 2022-07-12: 5 mg via ORAL
  Filled 2022-07-11 (×2): qty 1
  Filled 2022-07-11: qty 2

## 2022-07-11 MED ORDER — IRBESARTAN 150 MG PO TABS
150.0000 mg | ORAL_TABLET | Freq: Every morning | ORAL | Status: DC
  Administered 2022-07-12: 150 mg via ORAL
  Filled 2022-07-11: qty 1

## 2022-07-11 MED ORDER — OMEPRAZOLE 20 MG PO CPDR
40.0000 mg | DELAYED_RELEASE_CAPSULE | Freq: Every day | ORAL | Status: DC
  Administered 2022-07-11: 40 mg via ORAL
  Administered 2022-07-12: 20 mg via ORAL
  Filled 2022-07-11 (×2): qty 2

## 2022-07-11 MED ORDER — TRANEXAMIC ACID-NACL 1000-0.7 MG/100ML-% IV SOLN
1000.0000 mg | Freq: Once | INTRAVENOUS | Status: AC
  Administered 2022-07-11: 1000 mg via INTRAVENOUS
  Filled 2022-07-11: qty 100

## 2022-07-11 MED ORDER — CHLORHEXIDINE GLUCONATE 0.12 % MT SOLN
15.0000 mL | Freq: Once | OROMUCOSAL | Status: AC
  Administered 2022-07-11: 15 mL via OROMUCOSAL

## 2022-07-11 MED ORDER — EPINEPHRINE PF 1 MG/ML IJ SOLN
INTRAMUSCULAR | Status: AC
  Filled 2022-07-11: qty 1

## 2022-07-11 MED ORDER — ONDANSETRON HCL 4 MG/2ML IJ SOLN
INTRAMUSCULAR | Status: AC
  Filled 2022-07-11: qty 2

## 2022-07-11 MED ORDER — DEXAMETHASONE SODIUM PHOSPHATE 10 MG/ML IJ SOLN
INTRAMUSCULAR | Status: AC
  Filled 2022-07-11: qty 1

## 2022-07-11 MED ORDER — APIXABAN 5 MG PO TABS
5.0000 mg | ORAL_TABLET | Freq: Two times a day (BID) | ORAL | Status: DC
  Administered 2022-07-12: 5 mg via ORAL
  Filled 2022-07-11: qty 1

## 2022-07-11 MED ORDER — FLUTICASONE PROPIONATE 50 MCG/ACT NA SUSP
2.0000 | Freq: Every morning | NASAL | Status: DC
  Filled 2022-07-11: qty 16

## 2022-07-11 MED ORDER — FENTANYL CITRATE PF 50 MCG/ML IJ SOSY
PREFILLED_SYRINGE | INTRAMUSCULAR | Status: AC
  Filled 2022-07-11: qty 1

## 2022-07-11 MED ORDER — DOCUSATE SODIUM 100 MG PO CAPS
100.0000 mg | ORAL_CAPSULE | Freq: Two times a day (BID) | ORAL | Status: DC
  Administered 2022-07-11 (×2): 100 mg via ORAL
  Filled 2022-07-11 (×3): qty 1

## 2022-07-11 MED ORDER — PHENYLEPHRINE HCL-NACL 20-0.9 MG/250ML-% IV SOLN
INTRAVENOUS | Status: AC
  Filled 2022-07-11: qty 250

## 2022-07-11 MED ORDER — PROPOFOL 10 MG/ML IV BOLUS
INTRAVENOUS | Status: AC
  Filled 2022-07-11: qty 20

## 2022-07-11 MED ORDER — TERAZOSIN HCL 1 MG PO CAPS
1.0000 mg | ORAL_CAPSULE | Freq: Every day | ORAL | Status: DC
  Administered 2022-07-11: 1 mg via ORAL
  Filled 2022-07-11: qty 1

## 2022-07-11 MED ORDER — BUPIVACAINE-EPINEPHRINE (PF) 0.5% -1:200000 IJ SOLN
INTRAMUSCULAR | Status: DC | PRN
  Administered 2022-07-11: 30 mL via PERINEURAL

## 2022-07-11 MED ORDER — PROPOFOL 1000 MG/100ML IV EMUL
INTRAVENOUS | Status: AC
  Filled 2022-07-11: qty 100

## 2022-07-11 MED ORDER — 0.9 % SODIUM CHLORIDE (POUR BTL) OPTIME
TOPICAL | Status: DC | PRN
  Administered 2022-07-11: 1000 mL

## 2022-07-11 MED ORDER — ACETAMINOPHEN 500 MG PO TABS
1000.0000 mg | ORAL_TABLET | Freq: Four times a day (QID) | ORAL | Status: DC
  Administered 2022-07-11: 1000 mg via ORAL
  Filled 2022-07-11: qty 2

## 2022-07-11 MED ORDER — BUPIVACAINE-EPINEPHRINE (PF) 0.25% -1:200000 IJ SOLN
INTRAMUSCULAR | Status: DC | PRN
  Administered 2022-07-11: 30 mL

## 2022-07-11 MED ORDER — METHOCARBAMOL 500 MG IVPB - SIMPLE MED
500.0000 mg | Freq: Four times a day (QID) | INTRAVENOUS | Status: DC | PRN
  Administered 2022-07-11: 500 mg via INTRAVENOUS

## 2022-07-11 MED ORDER — POTASSIUM CHLORIDE ER 10 MEQ PO TBCR
10.0000 meq | EXTENDED_RELEASE_TABLET | Freq: Every morning | ORAL | Status: DC
  Administered 2022-07-12: 10 meq via ORAL
  Filled 2022-07-11 (×2): qty 1

## 2022-07-11 MED ORDER — BUPIVACAINE HCL (PF) 0.25 % IJ SOLN
INTRAMUSCULAR | Status: AC
  Filled 2022-07-11: qty 30

## 2022-07-11 MED ORDER — MIDAZOLAM HCL 2 MG/2ML IJ SOLN
1.0000 mg | INTRAMUSCULAR | Status: DC
  Filled 2022-07-11: qty 2

## 2022-07-11 MED ORDER — SODIUM CHLORIDE (PF) 0.9 % IJ SOLN
INTRAMUSCULAR | Status: AC
  Filled 2022-07-11: qty 30

## 2022-07-11 MED ORDER — CEFAZOLIN SODIUM-DEXTROSE 2-4 GM/100ML-% IV SOLN
2.0000 g | INTRAVENOUS | Status: AC
  Administered 2022-07-11: 2 g via INTRAVENOUS
  Filled 2022-07-11: qty 100

## 2022-07-11 MED ORDER — ORAL CARE MOUTH RINSE: 15.0000 mL | Freq: Once | OROMUCOSAL | Status: AC

## 2022-07-11 MED ORDER — SODIUM CHLORIDE 0.9 % IV SOLN: INTRAVENOUS | Status: DC

## 2022-07-11 MED ORDER — LIDOCAINE HCL (PF) 2 % IJ SOLN
INTRAMUSCULAR | Status: AC
  Filled 2022-07-11: qty 5

## 2022-07-11 MED ORDER — FENTANYL CITRATE PF 50 MCG/ML IJ SOSY
PREFILLED_SYRINGE | INTRAMUSCULAR | Status: AC
  Filled 2022-07-11: qty 2

## 2022-07-11 MED ORDER — METOCLOPRAMIDE HCL 5 MG/ML IJ SOLN: 5.0000 mg | Freq: Three times a day (TID) | INTRAMUSCULAR | Status: DC | PRN

## 2022-07-11 MED ORDER — DEXAMETHASONE SODIUM PHOSPHATE 10 MG/ML IJ SOLN
10.0000 mg | Freq: Once | INTRAMUSCULAR | Status: AC
  Administered 2022-07-12: 10 mg via INTRAVENOUS
  Filled 2022-07-11: qty 1

## 2022-07-11 MED ORDER — FENTANYL CITRATE (PF) 100 MCG/2ML IJ SOLN
INTRAMUSCULAR | Status: AC
  Filled 2022-07-11: qty 2

## 2022-07-11 MED ORDER — EPHEDRINE 5 MG/ML INJ
INTRAVENOUS | Status: AC
  Filled 2022-07-11: qty 5

## 2022-07-11 MED ORDER — ACETAMINOPHEN 10 MG/ML IV SOLN
INTRAVENOUS | Status: AC
  Filled 2022-07-11: qty 100

## 2022-07-11 MED ORDER — ACETAMINOPHEN 325 MG PO TABS: 325.0000 mg | ORAL_TABLET | Freq: Four times a day (QID) | ORAL | Status: DC | PRN

## 2022-07-11 MED ORDER — CEFAZOLIN SODIUM-DEXTROSE 2-4 GM/100ML-% IV SOLN
2.0000 g | Freq: Four times a day (QID) | INTRAVENOUS | Status: AC
  Administered 2022-07-11 (×2): 2 g via INTRAVENOUS
  Filled 2022-07-11 (×2): qty 100

## 2022-07-11 MED ORDER — ACETAMINOPHEN 10 MG/ML IV SOLN
1000.0000 mg | Freq: Once | INTRAVENOUS | Status: DC | PRN
  Administered 2022-07-11: 1000 mg via INTRAVENOUS

## 2022-07-11 MED ORDER — MENTHOL 3 MG MT LOZG: 1.0000 | LOZENGE | OROMUCOSAL | Status: DC | PRN

## 2022-07-11 MED ORDER — AMISULPRIDE (ANTIEMETIC) 5 MG/2ML IV SOLN
INTRAVENOUS | Status: AC
  Administered 2022-07-11: 10 mg via INTRAVENOUS
  Filled 2022-07-11: qty 4

## 2022-07-11 MED ORDER — FENTANYL CITRATE PF 50 MCG/ML IJ SOSY
50.0000 ug | PREFILLED_SYRINGE | INTRAMUSCULAR | Status: DC
  Administered 2022-07-11: 50 ug via INTRAVENOUS
  Filled 2022-07-11: qty 2

## 2022-07-11 MED ORDER — OXYCODONE HCL 5 MG PO TABS: 10.0000 mg | ORAL_TABLET | ORAL | Status: DC | PRN

## 2022-07-11 MED ORDER — LACTULOSE 10 GM/15ML PO SOLN
10.0000 g | Freq: Three times a day (TID) | ORAL | Status: DC
  Administered 2022-07-11 (×2): 10 g via ORAL
  Filled 2022-07-11 (×3): qty 15

## 2022-07-11 MED ORDER — METOCLOPRAMIDE HCL 5 MG PO TABS: 5.0000 mg | ORAL_TABLET | Freq: Three times a day (TID) | ORAL | Status: DC | PRN

## 2022-07-11 MED ORDER — SODIUM CHLORIDE (PF) 0.9 % IJ SOLN
INTRAMUSCULAR | Status: DC | PRN
  Administered 2022-07-11: 30 mL

## 2022-07-11 MED ORDER — METHOCARBAMOL 500 MG IVPB - SIMPLE MED
INTRAVENOUS | Status: AC
  Filled 2022-07-11: qty 55

## 2022-07-11 MED ORDER — HYDROCHLOROTHIAZIDE 12.5 MG PO TABS
12.5000 mg | ORAL_TABLET | Freq: Every day | ORAL | Status: DC
  Filled 2022-07-11: qty 1

## 2022-07-11 MED ORDER — DEXAMETHASONE SODIUM PHOSPHATE 10 MG/ML IJ SOLN
8.0000 mg | Freq: Once | INTRAMUSCULAR | Status: AC
  Administered 2022-07-11: 8 mg via INTRAVENOUS

## 2022-07-11 MED ORDER — SELENIUM 100 MCG PO TABS: 100.0000 ug | ORAL_TABLET | Freq: Every morning | ORAL | Status: DC

## 2022-07-11 MED ORDER — DIPHENHYDRAMINE HCL 12.5 MG/5ML PO ELIX: 12.5000 mg | ORAL_SOLUTION | ORAL | Status: DC | PRN

## 2022-07-11 MED ORDER — ONDANSETRON HCL 4 MG PO TABS: 4.0000 mg | ORAL_TABLET | Freq: Four times a day (QID) | ORAL | Status: DC | PRN

## 2022-07-11 MED ORDER — PRAVASTATIN SODIUM 40 MG PO TABS
80.0000 mg | ORAL_TABLET | Freq: Every morning | ORAL | Status: DC
  Administered 2022-07-12: 80 mg via ORAL
  Filled 2022-07-11: qty 2

## 2022-07-11 MED ORDER — BISACODYL 10 MG RE SUPP: 10.0000 mg | Freq: Every day | RECTAL | Status: DC | PRN

## 2022-07-11 MED ORDER — FENTANYL CITRATE PF 50 MCG/ML IJ SOSY
25.0000 ug | PREFILLED_SYRINGE | INTRAMUSCULAR | Status: DC | PRN
  Administered 2022-07-11 (×2): 50 ug via INTRAVENOUS

## 2022-07-11 MED ORDER — KETOROLAC TROMETHAMINE 30 MG/ML IJ SOLN
INTRAMUSCULAR | Status: AC
  Filled 2022-07-11: qty 1

## 2022-07-11 MED ORDER — HYDROMORPHONE HCL 1 MG/ML IJ SOLN: 0.5000 mg | INTRAMUSCULAR | Status: DC | PRN

## 2022-07-11 MED ORDER — INSULIN ASPART 100 UNIT/ML IJ SOLN
0.0000 [IU] | Freq: Three times a day (TID) | INTRAMUSCULAR | Status: DC
  Administered 2022-07-11: 5 [IU] via SUBCUTANEOUS
  Administered 2022-07-12: 2 [IU] via SUBCUTANEOUS

## 2022-07-11 MED ORDER — PANTOPRAZOLE SODIUM 40 MG PO TBEC: 40.0000 mg | DELAYED_RELEASE_TABLET | Freq: Every day | ORAL | Status: DC

## 2022-07-11 MED ORDER — TRANEXAMIC ACID-NACL 1000-0.7 MG/100ML-% IV SOLN
1000.0000 mg | INTRAVENOUS | Status: AC
  Administered 2022-07-11: 1000 mg via INTRAVENOUS
  Filled 2022-07-11: qty 100

## 2022-07-11 MED ORDER — METHOCARBAMOL 500 MG PO TABS
500.0000 mg | ORAL_TABLET | Freq: Four times a day (QID) | ORAL | Status: DC | PRN
  Administered 2022-07-11 – 2022-07-12 (×2): 500 mg via ORAL
  Filled 2022-07-11 (×2): qty 1

## 2022-07-11 MED ORDER — POVIDONE-IODINE 10 % EX SWAB
2.0000 | Freq: Once | CUTANEOUS | Status: AC
  Administered 2022-07-11: 2 via TOPICAL

## 2022-07-11 MED ORDER — POLYETHYLENE GLYCOL 3350 17 G PO PACK
17.0000 g | PACK | Freq: Two times a day (BID) | ORAL | Status: DC
  Administered 2022-07-11: 17 g via ORAL
  Filled 2022-07-11 (×3): qty 1

## 2022-07-11 SURGICAL SUPPLY — 52 items
ATTUNE MED ANAT PAT 38 KNEE (Knees) IMPLANT
BAG COUNTER SPONGE SURGICOUNT (BAG) IMPLANT
BAG ZIPLOCK 12X15 (MISCELLANEOUS) IMPLANT
BASEPLATE TIB CMT FB PCKT SZ 8 (Knees) IMPLANT
BLADE SAW SGTL 11.0X1.19X90.0M (BLADE) IMPLANT
BLADE SAW SGTL 13.0X1.19X90.0M (BLADE) ×1 IMPLANT
BNDG ELASTIC 6X5.8 VLCR STR LF (GAUZE/BANDAGES/DRESSINGS) ×1 IMPLANT
BOWL SMART MIX CTS (DISPOSABLE) ×1 IMPLANT
CEMENT HV SMART SET (Cement) IMPLANT
COMP FEM CMT ATTUNE RT 6 (Joint) ×1 IMPLANT
COMPONENT FEM CMT ATTUNE RT 6 (Joint) IMPLANT
COVER SURGICAL LIGHT HANDLE (MISCELLANEOUS) ×1 IMPLANT
CUFF TOURN SGL QUICK 34 (TOURNIQUET CUFF) ×1
CUFF TRNQT CYL 34X4.125X (TOURNIQUET CUFF) ×1 IMPLANT
DERMABOND ADVANCED .7 DNX12 (GAUZE/BANDAGES/DRESSINGS) ×1 IMPLANT
DRAPE U-SHAPE 47X51 STRL (DRAPES) ×1 IMPLANT
DRESSING AQUACEL AG SP 3.5X10 (GAUZE/BANDAGES/DRESSINGS) ×1 IMPLANT
DRSG AQUACEL AG ADV 3.5X10 (GAUZE/BANDAGES/DRESSINGS) IMPLANT
DRSG AQUACEL AG SP 3.5X10 (GAUZE/BANDAGES/DRESSINGS) ×1
DURAPREP 26ML APPLICATOR (WOUND CARE) ×2 IMPLANT
ELECT REM PT RETURN 15FT ADLT (MISCELLANEOUS) ×1 IMPLANT
GLOVE BIO SURGEON STRL SZ 6 (GLOVE) ×1 IMPLANT
GLOVE BIOGEL PI IND STRL 6.5 (GLOVE) ×1 IMPLANT
GLOVE BIOGEL PI IND STRL 7.5 (GLOVE) ×1 IMPLANT
GLOVE ORTHO TXT STRL SZ7.5 (GLOVE) ×2 IMPLANT
GOWN STRL REUS W/ TWL LRG LVL3 (GOWN DISPOSABLE) ×2 IMPLANT
GOWN STRL REUS W/TWL LRG LVL3 (GOWN DISPOSABLE) ×2
HANDPIECE INTERPULSE COAX TIP (DISPOSABLE) ×1
HOLDER FOLEY CATH W/STRAP (MISCELLANEOUS) IMPLANT
INSERT MED ATTUNE KNEE RT 6 10 (Insert) IMPLANT
KIT TURNOVER KIT A (KITS) IMPLANT
MANIFOLD NEPTUNE II (INSTRUMENTS) ×1 IMPLANT
NDL SAFETY ECLIP 18X1.5 (MISCELLANEOUS) IMPLANT
NS IRRIG 1000ML POUR BTL (IV SOLUTION) ×1 IMPLANT
PACK TOTAL KNEE CUSTOM (KITS) ×1 IMPLANT
PADDING CAST ABS COTTON 6X4 NS (CAST SUPPLIES) IMPLANT
PIN FIX SIGMA LCS THRD HI (PIN) IMPLANT
PROTECTOR NERVE ULNAR (MISCELLANEOUS) ×1 IMPLANT
SET HNDPC FAN SPRY TIP SCT (DISPOSABLE) ×1 IMPLANT
SET PAD KNEE POSITIONER (MISCELLANEOUS) ×1 IMPLANT
SPIKE FLUID TRANSFER (MISCELLANEOUS) ×2 IMPLANT
SUT MNCRL AB 4-0 PS2 18 (SUTURE) ×1 IMPLANT
SUT STRATAFIX PDS+ 0 24IN (SUTURE) ×1 IMPLANT
SUT VIC AB 1 CT1 36 (SUTURE) ×1 IMPLANT
SUT VIC AB 2-0 CT1 27 (SUTURE) ×2
SUT VIC AB 2-0 CT1 TAPERPNT 27 (SUTURE) ×2 IMPLANT
SYR 3ML LL SCALE MARK (SYRINGE) ×1 IMPLANT
TOWEL GREEN STERILE FF (TOWEL DISPOSABLE) ×1 IMPLANT
TRAY FOLEY MTR SLVR 16FR STAT (SET/KITS/TRAYS/PACK) ×1 IMPLANT
TUBE SUCTION HIGH CAP CLEAR NV (SUCTIONS) ×1 IMPLANT
WATER STERILE IRR 1000ML POUR (IV SOLUTION) ×2 IMPLANT
WRAP KNEE MAXI GEL POST OP (GAUZE/BANDAGES/DRESSINGS) ×1 IMPLANT

## 2022-07-11 NOTE — Anesthesia Procedure Notes (Signed)
Procedure Name: LMA Insertion Date/Time: 07/11/2022 9:53 AM  Performed by: Lind Covert, CRNAPre-anesthesia Checklist: Patient identified, Suction available, Emergency Drugs available, Patient being monitored and Timeout performed Patient Re-evaluated:Patient Re-evaluated prior to induction Oxygen Delivery Method: Circle system utilized Preoxygenation: Pre-oxygenation with 100% oxygen Induction Type: IV induction LMA: LMA with gastric port inserted LMA Size: 5.0 Tube type: Oral Number of attempts: 1 Placement Confirmation: positive ETCO2 and breath sounds checked- equal and bilateral Tube secured with: Tape Dental Injury: Teeth and Oropharynx as per pre-operative assessment

## 2022-07-11 NOTE — Transfer of Care (Signed)
Immediate Anesthesia Transfer of Care Note  Patient: Patrick Klein  Procedure(s) Performed: TOTAL KNEE ARTHROPLASTY (Right: Knee)  Patient Location: PACU  Anesthesia Type:General  Level of Consciousness: awake, alert , and oriented  Airway & Oxygen Therapy: Patient Spontanous Breathing and Patient connected to face mask oxygen  Post-op Assessment: Report given to RN and Post -op Vital signs reviewed and stable  Post vital signs: Reviewed and stable  Last Vitals:  Vitals Value Taken Time  BP 120/66 07/11/22 1130  Temp    Pulse 67 07/11/22 1132  Resp 18 07/11/22 1132  SpO2 98 % 07/11/22 1132  Vitals shown include unvalidated device data.  Last Pain:  Vitals:   07/11/22 0927  TempSrc:   PainSc: 0-No pain         Complications: No notable events documented.

## 2022-07-11 NOTE — Discharge Instructions (Signed)

## 2022-07-11 NOTE — Anesthesia Procedure Notes (Signed)
Anesthesia Regional Block: Adductor canal block   Pre-Anesthetic Checklist: , timeout performed,  Correct Patient, Correct Site, Correct Laterality,  Correct Procedure,, site marked,  Risks and benefits discussed,  Surgical consent,  Pre-op evaluation,  At surgeon's request and post-op pain management  Laterality: Right  Prep: chloraprep       Needles:  Injection technique: Single-shot  Needle Type: Echogenic Stimulator Needle     Needle Length: 10cm  Needle Gauge: 20     Additional Needles:   Procedures:,,,, ultrasound used (permanent image in chart),,    Narrative:  Start time: 07/11/2022 9:15 AM End time: 07/11/2022 9:25 AM Injection made incrementally with aspirations every 5 mL.  Performed by: Personally  Anesthesiologist: Murvin Natal, MD  Additional Notes: Functioning IV was confirmed and monitors were applied. A time-out was performed. Hand hygiene and sterile gloves were used. The thigh was placed in a frog-leg position and prepped in a sterile fashion. A 157m 20ga Bbraun echogenic stimulator needle was placed using ultrasound guidance.  Negative aspiration and negative test dose prior to incremental administration of local anesthetic. The patient tolerated the procedure well.

## 2022-07-11 NOTE — Anesthesia Postprocedure Evaluation (Signed)
Anesthesia Post Note  Patient: Patrick Klein  Procedure(s) Performed: TOTAL KNEE ARTHROPLASTY (Right: Knee)     Patient location during evaluation: PACU Anesthesia Type: Regional and General Level of consciousness: awake Pain management: pain level controlled Vital Signs Assessment: post-procedure vital signs reviewed and stable Respiratory status: spontaneous breathing, nonlabored ventilation and respiratory function stable Cardiovascular status: blood pressure returned to baseline and stable Postop Assessment: no apparent nausea or vomiting Anesthetic complications: no   No notable events documented.  Last Vitals:  Vitals:   07/11/22 1200 07/11/22 1349  BP: 118/68 118/61  Pulse: (!) 59 (!) 54  Resp: 13 16  Temp:  (!) 36.4 C  SpO2: 96% 99%    Last Pain:  Vitals:   07/11/22 1349  TempSrc: Oral  PainSc: 0-No pain                 Kiev Labrosse P Infant Doane

## 2022-07-11 NOTE — Interval H&P Note (Signed)
History and Physical Interval Note:  07/11/2022 7:11 AM  Patrick Klein  has presented today for surgery, with the diagnosis of Right knee osteoarthritis.  The various methods of treatment have been discussed with the patient and family. After consideration of risks, benefits and other options for treatment, the patient has consented to  Procedure(s) with comments: TOTAL KNEE ARTHROPLASTY (Right) - 90 as a surgical intervention.  The patient's history has been reviewed, patient examined, no change in status, stable for surgery.  I have reviewed the patient's chart and labs.  Questions were answered to the patient's satisfaction.     Mauri Pole

## 2022-07-11 NOTE — Op Note (Signed)
NAME:  Patrick Klein                      MEDICAL RECORD NO.:  [id number]                             FACILITY:  [facility]      PHYSICIAN:  Pietro Cassis. Alvan Dame, M.D.  DATE OF BIRTH:  11-03-1945      DATE OF PROCEDURE:  07/11/2022                                     OPERATIVE REPORT         PREOPERATIVE DIAGNOSIS:  Right knee osteoarthritis.      POSTOPERATIVE DIAGNOSIS:  Right knee osteoarthritis.      FINDINGS:  The patient was noted to have complete loss of cartilage and   bone-on-bone arthritis with associated osteophytes in the medial and patellofemoral compartments of   the knee with a significant synovitis and associated effusion.  The patient had failed months of conservative treatment including medications, injection therapy, activity modification.     PROCEDURE:  Right total knee replacement.      COMPONENTS USED:  DePuy Attune CR MS knee   system, a size 6 femur, 8 tibia, size 10 mm CR MS AOX insert, and 38 anatomic patellar   button.      SURGEON:  Pietro Cassis. Alvan Dame, M.D.      ASSISTANT:  Costella Hatcher, PA-C.      ANESTHESIA:  General and Regional.      SPECIMENS:  None.      COMPLICATION:  None.      DRAINS:  None.  EBL: <200 cc      TOURNIQUET TIME:  29 min at 225 mmHg     The patient was stable to the recovery room.      INDICATION FOR PROCEDURE:  Patrick Klein is a 77 y.o. male patient of   mine.  The patient had been seen, evaluated, and treated for months conservatively in the   office with medication, activity modification, and injections.  The patient had   radiographic changes of bone-on-bone arthritis with endplate sclerosis and osteophytes noted.  Based on the radiographic changes and failed conservative measures, the patient   decided to proceed with definitive treatment, total knee replacement.  Risks of infection, DVT, component failure, need for revision surgery, neurovascular injury were reviewed in the office setting.  The postop course was reviewed  stressing the efforts to maximize post-operative satisfaction and function.  Consent was obtained for benefit of pain   relief.      PROCEDURE IN DETAIL:  The patient was brought to the operative theater.   Once adequate anesthesia, preoperative antibiotics, 2 gm of Ancef,1 gm of Tranexamic Acid, and 10 mg of Decadron administered, the patient was positioned supine with a right thigh tourniquet placed.  The  right lower extremity was prepped and draped in sterile fashion.  A time-   out was performed identifying the patient, planned procedure, and the appropriate extremity.      The right lower extremity was placed in the [facility] leg holder.  The leg was   exsanguinated, tourniquet elevated to 225 mmHg.  A midline incision was   made followed by median parapatellar arthrotomy.  Following initial   exposure, attention was first directed to  the patella.  Precut   measurement was noted to be 25 mm.  I resected down to 13-14 mm and used a   38 anatomic patellar button to restore patellar height as well as cover the cut surface.      The lug holes were drilled and a metal shim was placed to protect the   patella from retractors and saw blade during the procedure.      At this point, attention was now directed to the femur.  The femoral   canal was opened with a drill, irrigated to try to prevent fat emboli.  An   intramedullary rod was passed at 5 degrees valgus, 11 mm of bone was   resected off the distal femur due to pre-operative flexion contrscture.  Following this resection, the tibia was   subluxated anteriorly.  Using the extramedullary guide, 2 mm of bone was resected off   the proximal medial tibia.  We confirmed the gap would be   stable medially and laterally with a size 6 spacer block as well as confirmed that the tibial cut was perpendicular in the coronal plane, checking with an alignment rod.      Once this was done, I sized the femur to be a size 6 in the anterior-   posterior  dimension, chose a standard component based on medial and   lateral dimension.  The size 6 rotation block was then pinned in   position anterior referenced using the C-clamp to set rotation.  The   anterior, posterior, and  chamfer cuts were made without difficulty nor   notching making certain that I was along the anterior cortex to help   with flexion gap stability.      The final shim cut was made off the lateral aspect of distal femur.      At this point, the tibia was sized to be a size 8.  Rotation of the tibial tray was set doing a trial reduction and marked on the proximal tibia.  The size 8 tray was   then pinned in position through the medial third of the tubercle,   drilled, and keel punched.  Trial reduction was now carried with a 6 femur,  8 tibia, a size 10 mm CR MS insert, and the 38 anatomic patella botton.  The knee was brought to full extension with good flexion stability with the patella   tracking through the trochlea without application of pressure.  Given   all these findings the trial components removed.  Final components were   opened and cement was mixed.  The knee was irrigated with normal saline solution and pulse lavage.  The synovial lining was   then injected with 30 cc of 0.25% Marcaine with epinephrine, 1 cc of Toradol and 30 cc of NS for a total of 61 cc.     Final implants were then cemented onto cleaned and dried cut surfaces of bone with the knee brought to extension with a size 10  mm CR MS trial insert.      Once the cement had fully cured, excess cement was removed   throughout the knee.  I confirmed that I was satisfied with the range of   motion and stability, and the final 10 mm CR MS AOX insert was chosen.  It was   placed into the knee.      The tourniquet had been let down at 29 minutes.  No significant   hemostasis was required.  The extensor mechanism was then reapproximated using #1 Vicryl and #1 Stratafix sutures with the knee   in flexion.   The   remaining wound was closed with 2-0 Vicryl and running 4-0 Monocryl.   The knee was cleaned, dried, dressed sterilely using Dermabond and   Aquacel dressing.  The patient was then   brought to recovery room in stable condition, tolerating the procedure   well.   Please note that Physician Assistant, Costella Hatcher, PA-C was present for the entirety of the case, and was utilized for pre-operative positioning, peri-operative retractor management, general facilitation of the procedure and for primary wound closure at the end of the case.              Pietro Cassis Alvan Dame, M.D.    07/11/2022 10:02 AM

## 2022-07-11 NOTE — Evaluation (Signed)
Physical Therapy Evaluation Patient Details Name: Patrick Klein MRN: [id number] DOB: 15-Sep-1945 Today's Date: 07/11/2022  History of Present Illness  77 y.o. male admitted 07/11/22 for R TKA. PMH: DM, HTN, COPD, OA, RA, STEMI 2013, vertigo, afib.  Clinical Impression  Pt is s/p TKA resulting in the deficits listed below (see PT Problem List). Pt ambulated 81' with RW, no loss of balance. Initiated TKA HEP. Good progress expected.  Pt will benefit from skilled PT to increase their independence and safety with mobility to allow discharge to the venue listed below.         Recommendations for follow up therapy are one component of a multi-disciplinary discharge planning process, led by the attending physician.  Recommendations may be updated based on patient status, additional functional criteria and insurance authorization.  Follow Up Recommendations Follow physician's recommendations for discharge plan and follow up therapies      Assistance Recommended at Discharge Intermittent Supervision/Assistance  Patient can return home with the following  A little help with bathing/dressing/bathroom;Assistance with cooking/housework;Assist for transportation;Help with stairs or ramp for entrance    Equipment Recommendations Rolling walker (2 wheels)  Recommendations for Other Services       Functional Status Assessment Patient has had a recent decline in their functional status and demonstrates the ability to make significant improvements in function in a reasonable and predictable amount of time.     Precautions / Restrictions Precautions Precautions: Knee Precaution Booklet Issued: Yes (comment) Precaution Comments: reviewed no pillow under knee Restrictions Weight Bearing Restrictions: No RLE Weight Bearing: Weight bearing as tolerated      Mobility  Bed Mobility Overal bed mobility: Needs Assistance Bed Mobility: Supine to Sit     Supine to sit: Min assist     General bed  mobility comments: assist to raise trunk and support RLE    Transfers Overall transfer level: Needs assistance Equipment used: Rolling walker (2 wheels) Transfers: Sit to/from Stand Sit to Stand: Min guard, From elevated surface           General transfer comment: VCs hand placement    Ambulation/Gait Ambulation/Gait assistance: Min guard Gait Distance (Feet): 50 Feet Assistive device: Rolling walker (2 wheels) Gait Pattern/deviations: Step-to pattern, Decreased step length - right, Decreased step length - left Gait velocity: decr     General Gait Details: VCs sequencing, no loss of balance, distance limited by pain, no buckling of R knee  Stairs            Wheelchair Mobility    Modified Rankin (Stroke Patients Only)       Balance Overall balance assessment: Modified Independent                                           Pertinent Vitals/Pain Pain Assessment Pain Assessment: 0-10 Pain Score: 6  Pain Location: R knee with activity Pain Descriptors / Indicators: Sore Pain Intervention(s): Limited activity within patient's tolerance, Monitored during session, Patient requesting pain meds-RN notified, Ice applied    Home Living Family/patient expects to be discharged to:: Private residence Living Arrangements: Spouse/significant other Available Help at Discharge: Family;Available 24 hours/day Type of Home: House Home Access: Level entry     Alternate Level Stairs-Number of Steps: flight Home Layout: Two level Home Equipment: Cane - single point      Prior Function Prior Level of Function : Independent/Modified Independent  Mobility Comments: walks with SPC; denies falls in past 6 months       Hand Dominance        Extremity/Trunk Assessment   Upper Extremity Assessment Upper Extremity Assessment: Overall WFL for tasks assessed    Lower Extremity Assessment Lower Extremity Assessment: RLE  deficits/detail RLE Deficits / Details: SLR 3/5, knee ext 3/5, knee AAROM 5-30* limited by pain RLE Sensation: WNL RLE Coordination: WNL    Cervical / Trunk Assessment Cervical / Trunk Assessment: Normal  Communication   Communication: HOH  Cognition Arousal/Alertness: Awake/alert Behavior During Therapy: WFL for tasks assessed/performed Overall Cognitive Status: Within Functional Limits for tasks assessed                                          General Comments      Exercises Total Joint Exercises Ankle Circles/Pumps: AROM, Both, 10 reps, Supine Quad Sets: AROM, Right, 5 reps, Supine Heel Slides: AAROM, Right, 10 reps, Supine Long Arc Quad: AROM, Right, 5 reps, Seated   Assessment/Plan    PT Assessment Patient needs continued PT services  PT Problem List Decreased range of motion;Decreased activity tolerance;Decreased mobility;Pain       PT Treatment Interventions Gait training;DME instruction;Stair training;Functional mobility training;Therapeutic exercise    PT Goals (Current goals can be found in the Care Plan section)  Acute Rehab PT Goals 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 PT Goal Formulation: With patient Time For Goal Achievement: 07/18/22 Potential to Achieve Goals: Good    Frequency 7X/week     Co-evaluation               AM-PAC PT "6 Clicks" Mobility  Outcome Measure Help needed turning from your back to your side while in a flat bed without using bedrails?: A Little Help needed moving from lying on your back to sitting on the side of a flat bed without using bedrails?: A Little Help needed moving to and from a bed to a chair (including a wheelchair)?: A Little Help needed standing up from a chair using your arms (e.g., wheelchair or bedside chair)?: A Little Help needed to walk in hospital room?: A Little Help needed climbing 3-5 steps with a railing? : A Little 6 Click Score: 18    End  of Session Equipment Utilized During Treatment: Gait belt Activity Tolerance: Patient tolerated treatment well Patient left: in chair;with chair alarm set;with call bell/phone within reach Nurse Communication: Mobility status;Patient requests pain meds PT Visit Diagnosis: Muscle weakness (generalized) (M62.81);Difficulty in walking, not elsewhere classified (R26.2);Pain Pain - Right/Left: Right Pain - part of body: Knee    Time: [id number] PT Time Calculation (min) (ACUTE ONLY): 24 min   Charges:   PT Evaluation $PT Eval Moderate Complexity: 1 Mod          Philomena Doheny PT 07/11/2022  Acute Rehabilitation Services  Office [phone]

## 2022-07-11 NOTE — H&P (Signed)
TOTAL KNEE ADMISSION H&P  Patient is being admitted for right total knee arthroplasty.  Subjective:  Chief Complaint:right knee pain.  HPI: Patrick Klein, 77 y.o. male, has a history of pain and functional disability in the right knee due to arthritis and has failed non-surgical conservative treatments for greater than 12 weeks to includeNSAID's and/or analgesics, corticosteriod injections, and activity modification.  Onset of symptoms was gradual, starting 2 years ago with gradually worsening course since that time. The patient noted no past surgery on the right knee(s).  Patient currently rates pain in the right knee(s) at 8 out of 10 with activity. Patient has worsening of pain with activity and weight bearing, pain that interferes with activities of daily living, and pain with passive range of motion.  Patient has evidence of joint space narrowing by imaging studies. There is no active infection.  Patient Active Problem List   Diagnosis Date Noted   h/o ST elevation (STEMI) myocardial infarction involving left anterior descending coronary artery ([place]) 06/05/2022   CAD S/P percutaneous coronary angioplasty 06/05/2022   Hyperlipidemia associated with type 2 diabetes mellitus ([place]) 06/05/2022   Moderate aortic stenosis by prior echocardiogram 06/05/2022   PAF (paroxysmal atrial fibrillation) ([place]) 06/05/2022   Hypercoagulability due to atrial fibrillation ([place]) 06/05/2022   Preop cardiovascular exam 06/05/2022   Acute hypoxemic respiratory failure due to COVID-19 ([place]) 07/23/2019   Primary osteoarthritis of both hands 02/04/2018   Primary osteoarthritis of both knees 02/04/2018   Primary osteoarthritis of both feet 02/04/2018   Long term (current) use of systemic steroids 02/04/2018   DDD (degenerative disc disease), lumbar 02/04/2018   Dyslipidemia 02/04/2018   History of diabetes mellitus 02/04/2018   Essential hypertension 02/04/2018   History of right bundle branch block 02/04/2018    History of diverticulitis 02/04/2018   History of gastroesophageal reflux (GERD) 02/04/2018   History of COPD 02/04/2018   History of vertigo 02/04/2018   Past Medical History:  Diagnosis Date   COPD (chronic obstructive pulmonary disease) ([place])    Diabetes mellitus without complication ([place])    Diverticulitis    Fibromyalgia    Hard of hearing    Hypertension    Moderate aortic stenosis by prior echocardiogram 12/2021   Moderate AS-mean gradient 26 mmHg.   PAF (paroxysmal atrial fibrillation) ([place]) 10/2021   Was seen in the ER at [facility] ER.  Came in in A-fib with spontaneously converted rhythm.  Per ER and cardiology's note had chest pain in the setting of A-fib.   Pneumonia    Rheumatoid arthritis ([place])    In combination with osteoarthritis   ST elevation (STEMI) myocardial infarction involving left anterior descending coronary artery [facility] 2013   Isla Pence to LAD    Past Surgical History:  Procedure Laterality Date   CORONARY ANGIOPLASTY WITH STENT PLACEMENT Bilateral 2013   [facility] PCI in setting of anterior STEMI   HAND SURGERY Right 1986   reconstructive surgery on fingers    HERNIA REPAIR     x2   TRANSTHORACIC ECHOCARDIOGRAM  01/03/2022   [place]): EF 55 to 60%.  Apical akinesis.  Moderate aortic stenosis mean gradient 26 mmHg (diffuse thickening).  Moderate LA dilation    No current facility-administered medications for this encounter.   Allergies  Allergen Reactions   Ivp Dye [Iodinated Contrast Media] Anaphylaxis    Shock     Social History   Tobacco Use   Smoking status: Former    Types: Cigarettes  Quit date: 07/23/2011    Years since quitting: 10.9   Smokeless tobacco: Never  Substance Use Topics   Alcohol use: No    Family History  Problem Relation Age of Onset   Stroke Mother    Mitral valve prolapse Mother    Heart attack Father    Alcoholism Father    Alcoholism Brother    Congestive  Heart Failure Brother    Alcoholism Brother    Congestive Heart Failure Brother    Anxiety disorder Daughter    Healthy Daughter      Review of Systems  Constitutional:  Negative for chills and fever.  Respiratory:  Negative for cough and shortness of breath.   Cardiovascular:  Negative for chest pain.  Gastrointestinal:  Negative for nausea and vomiting.  Musculoskeletal:  Positive for arthralgias.     Objective:  Physical Exam Well nourished and well developed. General: Alert and oriented x3, cooperative and pleasant, no acute distress. Head: normocephalic, atraumatic, neck supple. Eyes: EOMI.  Musculoskeletal: Right knee exam: No palpable effusion, warmth erythema Slight flexion contracture associated with a mild genu varum Tenderness medial and anterior Flexion limited to 110 degrees with tightness over the anterior medial aspect of the knee Stable medial and lateral collateral ligaments No reproducible groin or referred pain with range of motion of the right hip  Calves soft and nontender. Motor function intact in LE. Strength 5/5 LE bilaterally. Neuro: Distal pulses 2+. Sensation to light touch intact in LE.  Vital signs in last 24 hours:    Labs:   Estimated body mass index is 28.89 kg/m as calculated from the following:   Height as of 07/05/22: 5' 8"$  (1.727 m).   Weight as of 07/05/22: 86.2 kg.   Imaging Review Plain radiographs demonstrate severe degenerative joint disease of the right knee(s). The overall alignment isneutral. The bone quality appears to be adequate for age and reported activity level.      Assessment/Plan:  End stage arthritis, right knee   The patient history, physical examination, clinical judgment of the provider and imaging studies are consistent with end stage degenerative joint disease of the right knee(s) and total knee arthroplasty is deemed medically necessary. The treatment options including medical management, injection  therapy arthroscopy and arthroplasty were discussed at length. The risks and benefits of total knee arthroplasty were presented and reviewed. The risks due to aseptic loosening, infection, stiffness, patella tracking problems, thromboembolic complications and other imponderables were discussed. The patient acknowledged the explanation, agreed to proceed with the plan and consent was signed. Patient is being admitted for inpatient treatment for surgery, pain control, PT, OT, prophylactic antibiotics, VTE prophylaxis, progressive ambulation and ADL's and discharge planning. The patient is planning to be discharged  home.   Therapy Plans: outpatient therapy at [facility] Disposition: Home with wife Planned DVT Prophylaxis: Eliquis 5 mg BID DME needed: walker PCP: Dr. Alvis Lemmings, clearance received Cardiologist: Dr. Ellyn Hack, clearance received TXA: IV Allergies: Contrast - anaphylaxis - ICU Anesthesia Concerns: none BMI: 30.2 Last HgbA1c: 6.0%   Other: - Wants to go home SDD - oxycodone, robaxin, tylenol - ice machine at the hospital - Diagnosed with a fib in the ER last summer > patient does not believe he has a fib, he is just getting established with Dr. Ellyn Hack now ~  Patient's anticipated LOS is less than 2 midnights, meeting these requirements: - Younger than 22 - Lives within 1 hour of care - Has a competent adult at home to recover with  post-op recover - NO history of  - Chronic pain requiring opiods  - Diabetes  - Coronary Artery Disease  - Heart failure  - Heart attack  - Stroke  - DVT/VTE  - Cardiac arrhythmia  - Respiratory Failure/COPD  - Renal failure  - Anemia  - Advanced Liver disease  Costella Hatcher, PA-C Orthopedic Surgery [facility] [place] [phone]

## 2022-07-12 DIAGNOSIS — E119 Type 2 diabetes mellitus without complications: Secondary | ICD-10-CM | POA: Diagnosis not present

## 2022-07-12 DIAGNOSIS — I10 Essential (primary) hypertension: Secondary | ICD-10-CM | POA: Diagnosis not present

## 2022-07-12 DIAGNOSIS — Z8616 Personal history of COVID-19: Secondary | ICD-10-CM | POA: Diagnosis not present

## 2022-07-12 DIAGNOSIS — M1711 Unilateral primary osteoarthritis, right knee: Secondary | ICD-10-CM | POA: Diagnosis not present

## 2022-07-12 DIAGNOSIS — I2510 Atherosclerotic heart disease of native coronary artery without angina pectoris: Secondary | ICD-10-CM | POA: Diagnosis not present

## 2022-07-12 DIAGNOSIS — I480 Paroxysmal atrial fibrillation: Secondary | ICD-10-CM | POA: Diagnosis not present

## 2022-07-12 DIAGNOSIS — Z87891 Personal history of nicotine dependence: Secondary | ICD-10-CM | POA: Diagnosis not present

## 2022-07-12 DIAGNOSIS — Z955 Presence of coronary angioplasty implant and graft: Secondary | ICD-10-CM | POA: Diagnosis not present

## 2022-07-12 DIAGNOSIS — J449 Chronic obstructive pulmonary disease, unspecified: Secondary | ICD-10-CM | POA: Diagnosis not present

## 2022-07-12 LAB — BASIC METABOLIC PANEL
Anion gap: 8 (ref 5–15)
BUN: 17 mg/dL (ref 8–23)
CO2: 22 mmol/L (ref 22–32)
Calcium: 8.6 mg/dL — ABNORMAL LOW (ref 8.9–10.3)
Chloride: 107 mmol/L (ref 98–111)
Creatinine, Ser: 0.68 mg/dL (ref 0.61–1.24)
GFR, Estimated: >60 mL/min (ref >60)
Glucose, Bld: 167 mg/dL — ABNORMAL HIGH (ref 70–99)
Potassium: 3.2 mmol/L — ABNORMAL LOW (ref 3.5–5.1)
Sodium: 137 mmol/L (ref 135–145)

## 2022-07-12 LAB — CBC
HCT: 31.8 % — ABNORMAL LOW (ref 39.0–52.0)
Hemoglobin: 9.9 g/dL — ABNORMAL LOW (ref 13.0–17.0)
MCH: 23.1 pg — ABNORMAL LOW (ref 26.0–34.0)
MCHC: 31.1 g/dL (ref 30.0–36.0)
MCV: 74.1 fL — ABNORMAL LOW (ref 80.0–100.0)
Platelets: 165 10*3/uL (ref 150–400)
RBC: 4.29 MIL/uL (ref 4.22–5.81)
RDW: 16.3 % — ABNORMAL HIGH (ref 11.5–15.5)
WBC: 10.5 10*3/uL (ref 4.0–10.5)
nRBC: 0 % (ref 0.0–0.2)

## 2022-07-12 LAB — GLUCOSE, CAPILLARY: Glucose-Capillary: 146 mg/dL — ABNORMAL HIGH (ref 70–99)

## 2022-07-12 MED ORDER — OXYCODONE HCL 5 MG PO TABS: 5.0000 mg | ORAL_TABLET | ORAL | 0 refills | Status: AC | PRN

## 2022-07-12 MED ORDER — ACETAMINOPHEN 500 MG PO TABS: 1000.0000 mg | ORAL_TABLET | Freq: Four times a day (QID) | ORAL | 0 refills | Status: AC

## 2022-07-12 MED ORDER — POTASSIUM CHLORIDE CRYS ER 20 MEQ PO TBCR
20.0000 meq | EXTENDED_RELEASE_TABLET | Freq: Once | ORAL | Status: AC
  Administered 2022-07-12: 20 meq via ORAL
  Filled 2022-07-12: qty 1

## 2022-07-12 MED ORDER — POLYETHYLENE GLYCOL 3350 17 G PO PACK: 17.0000 g | PACK | Freq: Two times a day (BID) | ORAL | 0 refills | Status: AC

## 2022-07-12 MED ORDER — CALCIUM CARBONATE ANTACID 500 MG PO CHEW
1.0000 | CHEWABLE_TABLET | Freq: Two times a day (BID) | ORAL | Status: DC | PRN
  Administered 2022-07-12: 200 mg via ORAL
  Filled 2022-07-12: qty 1

## 2022-07-12 MED ORDER — METHOCARBAMOL 500 MG PO TABS: 500.0000 mg | ORAL_TABLET | Freq: Four times a day (QID) | ORAL | 2 refills | Status: AC | PRN

## 2022-07-12 MED ORDER — SIMETHICONE 80 MG PO CHEW: 80.0000 mg | CHEWABLE_TABLET | Freq: Four times a day (QID) | ORAL | Status: DC | PRN

## 2022-07-12 NOTE — Plan of Care (Signed)
Problem: Education: Goal: Ability to describe self-care measures that may prevent or decrease complications (Diabetes Survival Skills Education) will improve Outcome: Adequate for Discharge Goal: Individualized Educational Video(s) Outcome: Adequate for Discharge   Problem: Coping: Goal: Ability to adjust to condition or change in health will improve Outcome: Adequate for Discharge   Problem: Fluid Volume: Goal: Ability to maintain a balanced intake and output will improve Outcome: Adequate for Discharge   Problem: Health Behavior/Discharge Planning: Goal: Ability to identify and utilize available resources and services will improve Outcome: Adequate for Discharge Goal: Ability to manage health-related needs will improve Outcome: Adequate for Discharge   Problem: Metabolic: Goal: Ability to maintain appropriate glucose levels will improve Outcome: Adequate for Discharge   Problem: Nutritional: Goal: Maintenance of adequate nutrition will improve Outcome: Adequate for Discharge Goal: Progress toward achieving an optimal weight will improve Outcome: Adequate for Discharge   Problem: Skin Integrity: Goal: Risk for impaired skin integrity will decrease Outcome: Adequate for Discharge   Problem: Tissue Perfusion: Goal: Adequacy of tissue perfusion will improve Outcome: Adequate for Discharge   Problem: Education: Goal: Knowledge of the prescribed therapeutic regimen will improve Outcome: Adequate for Discharge Goal: Individualized Educational Video(s) Outcome: Adequate for Discharge   Problem: Activity: Goal: Ability to avoid complications of mobility impairment will improve Outcome: Adequate for Discharge Goal: Range of joint motion will improve Outcome: Adequate for Discharge   Problem: Clinical Measurements: Goal: Postoperative complications will be avoided or minimized Outcome: Adequate for Discharge   Problem: Pain Management: Goal: Pain level will decrease  with appropriate interventions Outcome: Adequate for Discharge   Problem: Skin Integrity: Goal: Will show signs of wound healing Outcome: Adequate for Discharge   Problem: Education: Goal: Knowledge of General Education information will improve Description: Including pain rating scale, medication(s)/side effects and non-pharmacologic comfort measures Outcome: Adequate for Discharge   Problem: Health Behavior/Discharge Planning: Goal: Ability to manage health-related needs will improve Outcome: Adequate for Discharge   Problem: Clinical Measurements: Goal: Ability to maintain clinical measurements within normal limits will improve Outcome: Adequate for Discharge Goal: Will remain free from infection Outcome: Adequate for Discharge Goal: Diagnostic test results will improve Outcome: Adequate for Discharge Goal: Respiratory complications will improve Outcome: Adequate for Discharge Goal: Cardiovascular complication will be avoided Outcome: Adequate for Discharge   Problem: Activity: Goal: Risk for activity intolerance will decrease Outcome: Adequate for Discharge   Problem: Nutrition: Goal: Adequate nutrition will be maintained Outcome: Adequate for Discharge   Problem: Coping: Goal: Level of anxiety will decrease Outcome: Adequate for Discharge   Problem: Elimination: Goal: Will not experience complications related to bowel motility Outcome: Adequate for Discharge Goal: Will not experience complications related to urinary retention Outcome: Adequate for Discharge   Problem: Pain Managment: Goal: General experience of comfort will improve Outcome: Adequate for Discharge   Problem: Safety: Goal: Ability to remain free from injury will improve Outcome: Adequate for Discharge   Problem: Skin Integrity: Goal: Risk for impaired skin integrity will decrease Outcome: Adequate for Discharge   Problem: Education: Goal: Knowledge of General Education information will  improve Description: Including pain rating scale, medication(s)/side effects and non-pharmacologic comfort measures Outcome: Adequate for Discharge   Problem: Health Behavior/Discharge Planning: Goal: Ability to manage health-related needs will improve Outcome: Adequate for Discharge   Problem: Clinical Measurements: Goal: Ability to maintain clinical measurements within normal limits will improve Outcome: Adequate for Discharge Goal: Will remain free from infection Outcome: Adequate for Discharge Goal: Diagnostic test results will improve Outcome: Adequate  for Discharge Goal: Respiratory complications will improve Outcome: Adequate for Discharge Goal: Cardiovascular complication will be avoided Outcome: Adequate for Discharge   Problem: Activity: Goal: Risk for activity intolerance will decrease Outcome: Adequate for Discharge   Problem: Nutrition: Goal: Adequate nutrition will be maintained Outcome: Adequate for Discharge   Problem: Coping: Goal: Level of anxiety will decrease Outcome: Adequate for Discharge   Problem: Elimination: Goal: Will not experience complications related to bowel motility Outcome: Adequate for Discharge Goal: Will not experience complications related to urinary retention Outcome: Adequate for Discharge

## 2022-07-12 NOTE — Progress Notes (Signed)
Physical Therapy Treatment Patient Details Name: Patrick Klein MRN: [id number] DOB: 06-Feb-1946 Today's Date: 07/12/2022   History of Present Illness 77 y.o. male admitted 07/11/22 for R TKA. PMH: DM, HTN, COPD, OA, RA, STEMI 2013, vertigo, afib.    PT Comments    Pt has progressed well with mobility and is ready to DC home from a PT standpoint. He ambulated 180' with RW, no loss of balance. Stair training completed. He demonstrates good understanding of HEP.    Recommendations for follow up therapy are one component of a multi-disciplinary discharge planning process, led by the attending physician.  Recommendations may be updated based on patient status, additional functional criteria and insurance authorization.  Follow Up Recommendations  Follow physician's recommendations for discharge plan and follow up therapies     Assistance Recommended at Discharge Intermittent Supervision/Assistance  Patient can return home with the following A little help with bathing/dressing/bathroom;Assistance with cooking/housework;Assist for transportation;Help with stairs or ramp for entrance   Equipment Recommendations  Rolling walker (2 wheels)    Recommendations for Other Services       Precautions / Restrictions Precautions Precautions: Knee Precaution Booklet Issued: Yes (comment) Precaution Comments: reviewed no pillow under knee Restrictions Weight Bearing Restrictions: No RLE Weight Bearing: Weight bearing as tolerated     Mobility  Bed Mobility               General bed mobility comments: up in recliner    Transfers Overall transfer level: Needs assistance Equipment used: Rolling walker (2 wheels) Transfers: Sit to/from Stand Sit to Stand: Supervision           General transfer comment: VCs hand placement    Ambulation/Gait Ambulation/Gait assistance: Modified independent (Device/Increase time) Gait Distance (Feet): 180 Feet Assistive device: Rolling walker (2  wheels) Gait Pattern/deviations: Step-to pattern, Decreased step length - right, Decreased step length - left Gait velocity: WFL     General Gait Details: no loss of balance,  no buckling of R knee   Stairs Stairs: Yes Stairs assistance: Min guard Stair Management: One rail Left, With cane Number of Stairs: 4 General stair comments: VCs sequencing   Wheelchair Mobility    Modified Rankin (Stroke Patients Only)       Balance Overall balance assessment: Modified Independent                                          Cognition Arousal/Alertness: Awake/alert Behavior During Therapy: WFL for tasks assessed/performed Overall Cognitive Status: Within Functional Limits for tasks assessed                                          Exercises Total Joint Exercises Ankle Circles/Pumps: AROM, Both, 10 reps, Supine Quad Sets: AROM, Right, 5 reps, Supine Heel Slides: AAROM, Right, 10 reps, Supine Hip ABduction/ADduction: AROM, Right, 10 reps, Supine Straight Leg Raises: AROM, Right, 5 reps, Supine Long Arc Quad: AROM, Right, 5 reps, Seated Knee Flexion: AAROM, Right, 10 reps, Seated Goniometric ROM: 5-80* AAROM R knee    General Comments        Pertinent Vitals/Pain Pain Assessment Pain Score: 5  Pain Location: R knee with activity Pain Descriptors / Indicators: Sore Pain Intervention(s): Limited activity within patient's tolerance, Monitored during session, Premedicated before session, Ice applied  Home Living                          Prior Function            PT Goals (current goals can now be found in the care plan section) Acute Rehab PT Goals 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 PT Goal Formulation: With patient Time For Goal Achievement: 07/18/22 Potential to Achieve Goals: Good Progress towards PT goals: Progressing toward goals    Frequency    7X/week      PT Plan  Current plan remains appropriate    Co-evaluation              AM-PAC PT "6 Clicks" Mobility   Outcome Measure  Help needed turning from your back to your side while in a flat bed without using bedrails?: A Little Help needed moving from lying on your back to sitting on the side of a flat bed without using bedrails?: None Help needed moving to and from a bed to a chair (including a wheelchair)?: None Help needed standing up from a chair using your arms (e.g., wheelchair or bedside chair)?: None Help needed to walk in hospital room?: None Help needed climbing 3-5 steps with a railing? : A Little 6 Click Score: 22    End of Session Equipment Utilized During Treatment: Gait belt Activity Tolerance: Patient tolerated treatment well Patient left: in chair;with chair alarm set;with call bell/phone within reach Nurse Communication: Mobility status;Patient requests pain meds PT Visit Diagnosis: Muscle weakness (generalized) (M62.81);Difficulty in walking, not elsewhere classified (R26.2);Pain Pain - Right/Left: Right Pain - part of body: Knee     Time: 0912-0935 PT Time Calculation (min) (ACUTE ONLY): 23 min  Charges:  $Gait Training: 8-22 mins $Therapeutic Exercise: 8-22 mins                    Blondell Reveal Kistler PT 07/12/2022  Acute Rehabilitation Services  Office [phone]

## 2022-07-12 NOTE — TOC Transition Note (Signed)
Transition of Care [facility]) - CM/SW Discharge Note  Patient Details  Name: Patrick Klein MRN: [id number] Date of Birth: [birth year]  Transition of Care [facility]) CM/SW Contact:  Sherie Don, LCSW Phone Number: 07/12/2022, 9:57 AM  Clinical Narrative: Patient is expected to discharge home after working with PT. CSW met with patient to confirm discharge plan and needs. Patient will go home with OPPT at [facility]. Patient will need a rolling walker, which was delivered to patient's room by [employer]. TOC signing off.  Final next level of care: OP Rehab Barriers to Discharge: No Barriers Identified  Patient Goals and CMS Choice CMS Medicare.gov Compare Post Acute Care list provided to:: Patient Choice offered to / list presented to : Patient  Discharge Plan and Services Additional resources added to the After Visit Summary for          DME Arranged: Walker rolling DME Agency: [employer] Representative spoke with at DME Agency: Prearranged in orthopedist's office  Social Determinants of Health ([place]) Interventions SDOH Screenings   Food Insecurity: No Food Insecurity (07/12/2022)  Housing: Low Risk  (07/12/2022)  Transportation Needs: No Transportation Needs (07/12/2022)  Utilities: Not At Risk (07/12/2022)  Tobacco Use: Medium Risk (07/11/2022)   Readmission Risk Interventions     No data to display

## 2022-07-12 NOTE — Progress Notes (Signed)
Subjective: 1 Day Post-Op Procedure(s) (LRB): TOTAL KNEE ARTHROPLASTY (Right) Patient reports pain as mild.   Patient seen in rounds for Dr. Alvan Dame. Patient is well, and has had no acute complaints or problems. No acute events overnight. Foley catheter removed. Patient ambulated 50 feet with PT.  We will start therapy today.   Objective: Vital signs in last 24 hours: Temp:  [97.5 F (36.4 C)-98.2 F (36.8 C)] 98 F (36.7 C) (02/14 0510) Pulse Rate:  [54-96] 96 (02/14 0510) Resp:  [12-20] 14 (02/14 0510) BP: (118-160)/(57-89) 160/85 (02/14 0510) SpO2:  [92 %-99 %] 94 % (02/14 0510)  Intake/Output from previous day:  Intake/Output Summary (Last 24 hours) at 07/12/2022 0851 Last data filed at 07/12/2022 0510 Gross per 24 hour  Intake 2466.28 ml  Output 875 ml  Net 1591.28 ml     Intake/Output this shift: No intake/output data recorded.  Labs: Recent Labs    07/12/22 0340  HGB 9.9*   Recent Labs    07/12/22 0340  WBC 10.5  RBC 4.29  HCT 31.8*  PLT 165   Recent Labs    07/12/22 0340  NA 137  K 3.2*  CL 107  CO2 22  BUN 17  CREATININE 0.68  GLUCOSE 167*  CALCIUM 8.6*   No results for input(s): "LABPT", "INR" in the last 72 hours.  Exam: General - Patient is Alert and Oriented Extremity - Neurologically intact Sensation intact distally Intact pulses distally Dorsiflexion/Plantar flexion intact Dressing - dressing C/D/I Motor Function - intact, moving foot and toes well on exam.   Past Medical History:  Diagnosis Date   COPD (chronic obstructive pulmonary disease) (HCC)    Diabetes mellitus without complication (HCC)    Diverticulitis    Fibromyalgia    Hard of hearing    Hypertension    Moderate aortic stenosis by prior echocardiogram 12/2021   Moderate AS-mean gradient 26 mmHg.   PAF (paroxysmal atrial fibrillation) ([place]) 10/2021   Was seen in the ER at [facility] ER.  Came in in A-fib with spontaneously converted rhythm.  Per  ER and cardiology's note had chest pain in the setting of A-fib.   Pneumonia    Rheumatoid arthritis ([place])    In combination with osteoarthritis   ST elevation (STEMI) myocardial infarction involving left anterior descending coronary artery ([place]) 2013   [place]-PCI to LAD    Assessment/Plan: 1 Day Post-Op Procedure(s) (LRB): TOTAL KNEE ARTHROPLASTY (Right) Principal Problem:   S/P total knee arthroplasty, right  Estimated body mass index is 28.83 kg/m as calculated from the following:   Height as of this encounter: 5' 8"$  (1.727 m).   Weight as of this encounter: 86 kg. Advance diet Up with therapy D/C IV fluids   Patient's anticipated LOS is less than 2 midnights, meeting these requirements: - Younger than 73 - Lives within 1 hour of care - Has a competent adult at home to recover with post-op recover - NO history of  - Chronic pain requiring opiods  - Diabetes  - Coronary Artery Disease  - Heart failure  - Heart attack  - Stroke  - DVT/VTE  - Cardiac arrhythmia  - Respiratory Failure/COPD  - Renal failure  - Anemia  - Advanced Liver disease     DVT Prophylaxis - Aspirin Weight bearing as tolerated.  Hgb stable at 9.9 this AM. K at 3.2 this AM - takes potassium supplement daily. Will add 20 meq Kdur this AM.  Plan is  to go Home after hospital stay. Plan for discharge today following 1-2 sessions of PT as long as they are meeting their goals. Patient is scheduled for OPPT. Follow up in the office in 2 weeks.   Griffith Citron, PA-C Orthopedic Surgery [phone] 07/12/2022, 8:51 AM

## 2022-07-13 ENCOUNTER — Encounter (HOSPITAL_COMMUNITY): Payer: Self-pay | Admitting: Orthopedic Surgery

## 2022-07-19 DIAGNOSIS — M25561 Pain in right knee: Secondary | ICD-10-CM | POA: Diagnosis not present

## 2022-07-19 DIAGNOSIS — M25661 Stiffness of right knee, not elsewhere classified: Secondary | ICD-10-CM | POA: Diagnosis not present

## 2022-07-21 DIAGNOSIS — M25661 Stiffness of right knee, not elsewhere classified: Secondary | ICD-10-CM | POA: Diagnosis not present

## 2022-07-21 DIAGNOSIS — M25561 Pain in right knee: Secondary | ICD-10-CM | POA: Diagnosis not present

## 2022-07-24 DIAGNOSIS — M25561 Pain in right knee: Secondary | ICD-10-CM | POA: Diagnosis not present

## 2022-07-24 DIAGNOSIS — M25661 Stiffness of right knee, not elsewhere classified: Secondary | ICD-10-CM | POA: Diagnosis not present

## 2022-07-25 NOTE — Discharge Summary (Signed)
Patient ID: Patrick Klein MRN: [id number] DOB/AGE: August 22, 1945 77 y.o.  Admit date: 07/11/2022 Discharge date: 07/12/2022  Admission Diagnoses:  Right knee osteoarthritis  Discharge Diagnoses:  Principal Problem:   S/P total knee arthroplasty, right   Past Medical History:  Diagnosis Date   COPD (chronic obstructive pulmonary disease) ([place])    Diabetes mellitus without complication ([place])    Diverticulitis    Fibromyalgia    Hard of hearing    Hypertension    Moderate aortic stenosis by prior echocardiogram 12/2021   Moderate AS-mean gradient 26 mmHg.   PAF (paroxysmal atrial fibrillation) ([place]) 10/2021   Was seen in the ER at [facility] ER.  Came in in A-fib with spontaneously converted rhythm.  Per ER and cardiology's note had chest pain in the setting of A-fib.   Pneumonia    Rheumatoid arthritis ([place])    In combination with osteoarthritis   ST elevation (STEMI) myocardial infarction involving left anterior descending coronary artery [facility]) 2013   [place] to LAD    Surgeries: Procedure(s): TOTAL KNEE ARTHROPLASTY on 07/11/2022   Consultants:   Discharged Condition: Improved  Hospital Course: Hammond Burningham is an 77 y.o. male who was admitted 07/11/2022 for operative treatment ofS/P total knee arthroplasty, right. Patient has severe unremitting pain that affects sleep, daily activities, and work/hobbies. After pre-op clearance the patient was taken to the operating room on 07/11/2022 and underwent  Procedure(s): TOTAL KNEE ARTHROPLASTY.    Patient was given perioperative antibiotics:  Anti-infectives (From admission, onward)    Start     Dose/Rate Route Frequency Ordered Stop   07/11/22 1600  ceFAZolin (ANCEF) IVPB 2g/100 mL premix        2 g 200 mL/hr over 30 Minutes Intravenous Every 6 hours 07/11/22 1353 07/11/22 2238   07/11/22 0745  ceFAZolin (ANCEF) IVPB 2g/100 mL premix        2 g 200 mL/hr over 30 Minutes Intravenous On call to O.R.  07/11/22 [id number] 07/11/22 0954        Patient was given sequential compression devices, early ambulation, and chemoprophylaxis to prevent DVT. Patient worked with PT and was meeting their goals regarding safe ambulation and transfers.  Patient benefited maximally from hospital stay and there were no complications.    Recent vital signs: No data found.   Recent laboratory studies: No results for input(s): "WBC", "HGB", "HCT", "PLT", "NA", "K", "CL", "CO2", "BUN", "CREATININE", "GLUCOSE", "INR", "CALCIUM" in the last 72 hours.  Invalid input(s): "PT", "2"   Discharge Medications:   Allergies as of 07/12/2022       Reactions   Ivp Dye [iodinated Contrast Media] Anaphylaxis   Shock        Medication List     TAKE these medications    acetaminophen 500 MG tablet Commonly known as: TYLENOL Take 2 tablets (1,000 mg total) by mouth every 6 (six) hours.   apixaban 5 MG Tabs tablet Commonly known as: ELIQUIS Take 1 tablet (5 mg total) by mouth 2 (two) times daily.   diazepam 5 MG tablet Commonly known as: VALIUM Take 5 mg by mouth 2 (two) times daily as needed for anxiety.   diltiazem 120 MG 24 hr capsule Commonly known as: DILACOR XR Take 1 capsule (120 mg total) by mouth daily.   fluticasone 50 MCG/ACT nasal spray Commonly known as: FLONASE Place 2 sprays into both nostrils in the morning.   hydrochlorothiazide 12.5 MG tablet Commonly known as: HYDRODIURIL Take 12.5 mg by mouth  in the morning.   irbesartan 150 MG tablet Commonly known as: AVAPRO Take 150 mg by mouth in the morning.   Krill Oil 350 MG Caps Take 350 mg by mouth daily.   lactulose 10 GM/15ML solution Commonly known as: CHRONULAC Take 10 g by mouth 3 (three) times daily.   methocarbamol 500 MG tablet Commonly known as: ROBAXIN Take 1 tablet (500 mg total) by mouth every 6 (six) hours as needed for muscle spasms (muscle pain).   nitroGLYCERIN 0.4 MG SL tablet Commonly known as: NITROSTAT Place  0.4 mg under the tongue every 5 (five) minutes x 3 doses as needed for chest pain.   omeprazole 40 MG capsule Commonly known as: PRILOSEC Take 40 mg by mouth 4 (four) times a week.   oxyCODONE 5 MG immediate release tablet Commonly known as: Oxy IR/ROXICODONE Take 1 tablet (5 mg total) by mouth every 4 (four) hours as needed for severe pain.   polyethylene glycol 17 g packet Commonly known as: MIRALAX / GLYCOLAX Take 17 g by mouth 2 (two) times daily.   potassium chloride 10 MEQ tablet Commonly known as: KLOR-CON Take 10 mEq by mouth in the morning.   pravastatin 80 MG tablet Commonly known as: PRAVACHOL Take 80 mg by mouth in the morning.   Selenium 100 MCG Tabs Take 100 mcg by mouth in the morning.   terazosin 1 MG capsule Commonly known as: HYTRIN Take 1 mg by mouth at bedtime.   Trulicity [id number] Sopn Generic drug: Dulaglutide Inject 0.75 mg into the skin every Wednesday.   VITAMIN D3 PO Take 1 tablet by mouth in the morning.   ZINC PO Take 1 tablet by mouth in the morning.               Discharge Care Instructions  (From admission, onward)           Start     Ordered   07/12/22 0000  Change dressing       Comments: Maintain surgical dressing until follow up in the clinic. If the edges start to pull up, may reinforce with tape. If the dressing is no longer working, may remove and cover with gauze and tape, but must keep the area dry and clean.  Call with any questions or concerns.   07/12/22 0856            Diagnostic Studies: No results found.  Disposition: Discharge disposition: 01-Home or Self Care       Discharge Instructions     Call MD / Call 911   Complete by: As directed    If you experience chest pain or shortness of breath, CALL 911 and be transported to the hospital emergency room.  If you develope a fever above 101 F, pus (white drainage) or increased drainage or redness at the wound, or calf pain, call your surgeon's  office.   Change dressing   Complete by: As directed    Maintain surgical dressing until follow up in the clinic. If the edges start to pull up, may reinforce with tape. If the dressing is no longer working, may remove and cover with gauze and tape, but must keep the area dry and clean.  Call with any questions or concerns.   Constipation Prevention   Complete by: As directed    Drink plenty of fluids.  Prune juice may be helpful.  You may use a stool softener, such as Colace (over the counter) 100 mg twice a day.  Use MiraLax (over the counter) for constipation as needed.   Diet - low sodium heart healthy   Complete by: As directed    Increase activity slowly as tolerated   Complete by: As directed    Weight bearing as tolerated with assist device (walker, cane, etc) as directed, use it as long as suggested by your surgeon or therapist, typically at least 4-6 weeks.   Post-operative opioid taper instructions:   Complete by: As directed    POST-OPERATIVE OPIOID TAPER INSTRUCTIONS: It is important to wean off of your opioid medication as soon as possible. If you do not need pain medication after your surgery it is ok to stop day one. Opioids include: Codeine, Hydrocodone(Norco, Vicodin), Oxycodone(Percocet, oxycontin) and hydromorphone amongst others.  Long term and even short term use of opiods can cause: Increased pain response Dependence Constipation Depression Respiratory depression And more.  Withdrawal symptoms can include Flu like symptoms Nausea, vomiting And more Techniques to manage these symptoms Hydrate well Eat regular healthy meals Stay active Use relaxation techniques(deep breathing, meditating, yoga) Do Not substitute Alcohol to help with tapering If you have been on opioids for less than two weeks and do not have pain than it is ok to stop all together.  Plan to wean off of opioids This plan should start within one week post op of your joint replacement. Maintain  the same interval or time between taking each dose and first decrease the dose.  Cut the total daily intake of opioids by one tablet each day Next start to increase the time between doses. The last dose that should be eliminated is the evening dose.      TED hose   Complete by: As directed    Use stockings (TED hose) for 2 weeks on both leg(s).  You may remove them at night for sleeping.        Follow-up Information     Paralee Cancel, MD. Schedule an appointment as soon as possible for a visit in 2 week(s).   Specialty: Orthopedic Surgery Contact information: [address] [place] [address] [phone]                  Signed: Irving Copas 07/25/2022, 8:00 AM

## 2022-07-26 DIAGNOSIS — M25661 Stiffness of right knee, not elsewhere classified: Secondary | ICD-10-CM | POA: Diagnosis not present

## 2022-07-26 DIAGNOSIS — M25561 Pain in right knee: Secondary | ICD-10-CM | POA: Diagnosis not present

## 2022-07-28 DIAGNOSIS — M25661 Stiffness of right knee, not elsewhere classified: Secondary | ICD-10-CM | POA: Diagnosis not present

## 2022-07-28 DIAGNOSIS — M25561 Pain in right knee: Secondary | ICD-10-CM | POA: Diagnosis not present

## 2022-07-31 DIAGNOSIS — M25561 Pain in right knee: Secondary | ICD-10-CM | POA: Diagnosis not present

## 2022-07-31 DIAGNOSIS — M25661 Stiffness of right knee, not elsewhere classified: Secondary | ICD-10-CM | POA: Diagnosis not present

## 2022-08-02 DIAGNOSIS — M25661 Stiffness of right knee, not elsewhere classified: Secondary | ICD-10-CM | POA: Diagnosis not present

## 2022-08-02 DIAGNOSIS — M25561 Pain in right knee: Secondary | ICD-10-CM | POA: Diagnosis not present

## 2022-08-04 DIAGNOSIS — M25661 Stiffness of right knee, not elsewhere classified: Secondary | ICD-10-CM | POA: Diagnosis not present

## 2022-08-04 DIAGNOSIS — M25561 Pain in right knee: Secondary | ICD-10-CM | POA: Diagnosis not present

## 2022-08-08 DIAGNOSIS — M25661 Stiffness of right knee, not elsewhere classified: Secondary | ICD-10-CM | POA: Diagnosis not present

## 2022-08-08 DIAGNOSIS — M25561 Pain in right knee: Secondary | ICD-10-CM | POA: Diagnosis not present

## 2022-08-11 DIAGNOSIS — M25661 Stiffness of right knee, not elsewhere classified: Secondary | ICD-10-CM | POA: Diagnosis not present

## 2022-08-11 DIAGNOSIS — M25561 Pain in right knee: Secondary | ICD-10-CM | POA: Diagnosis not present

## 2022-08-15 DIAGNOSIS — M25661 Stiffness of right knee, not elsewhere classified: Secondary | ICD-10-CM | POA: Diagnosis not present

## 2022-08-15 DIAGNOSIS — M25561 Pain in right knee: Secondary | ICD-10-CM | POA: Diagnosis not present

## 2022-08-17 DIAGNOSIS — F419 Anxiety disorder, unspecified: Secondary | ICD-10-CM | POA: Diagnosis not present

## 2022-08-17 DIAGNOSIS — Z6828 Body mass index (BMI) 28.0-28.9, adult: Secondary | ICD-10-CM | POA: Diagnosis not present

## 2022-08-17 DIAGNOSIS — K219 Gastro-esophageal reflux disease without esophagitis: Secondary | ICD-10-CM | POA: Diagnosis not present

## 2022-08-17 DIAGNOSIS — E118 Type 2 diabetes mellitus with unspecified complications: Secondary | ICD-10-CM | POA: Diagnosis not present

## 2022-08-18 DIAGNOSIS — M25561 Pain in right knee: Secondary | ICD-10-CM | POA: Diagnosis not present

## 2022-08-18 DIAGNOSIS — M25661 Stiffness of right knee, not elsewhere classified: Secondary | ICD-10-CM | POA: Diagnosis not present

## 2022-08-22 DIAGNOSIS — M25561 Pain in right knee: Secondary | ICD-10-CM | POA: Diagnosis not present

## 2022-08-22 DIAGNOSIS — M25661 Stiffness of right knee, not elsewhere classified: Secondary | ICD-10-CM | POA: Diagnosis not present

## 2022-08-24 DIAGNOSIS — M25661 Stiffness of right knee, not elsewhere classified: Secondary | ICD-10-CM | POA: Diagnosis not present

## 2022-08-24 DIAGNOSIS — M25561 Pain in right knee: Secondary | ICD-10-CM | POA: Diagnosis not present

## 2022-08-25 DIAGNOSIS — Z471 Aftercare following joint replacement surgery: Secondary | ICD-10-CM | POA: Diagnosis not present

## 2022-08-25 DIAGNOSIS — Z96651 Presence of right artificial knee joint: Secondary | ICD-10-CM | POA: Diagnosis not present

## 2022-09-24 ENCOUNTER — Other Ambulatory Visit: Payer: Self-pay | Admitting: Cardiology

## 2022-10-06 ENCOUNTER — Other Ambulatory Visit (HOSPITAL_COMMUNITY): Payer: Medicare PPO

## 2022-10-10 DIAGNOSIS — E118 Type 2 diabetes mellitus with unspecified complications: Secondary | ICD-10-CM | POA: Diagnosis not present

## 2022-10-10 DIAGNOSIS — Z6830 Body mass index (BMI) 30.0-30.9, adult: Secondary | ICD-10-CM | POA: Diagnosis not present

## 2022-10-10 DIAGNOSIS — E1169 Type 2 diabetes mellitus with other specified complication: Secondary | ICD-10-CM | POA: Diagnosis not present

## 2022-10-10 DIAGNOSIS — E785 Hyperlipidemia, unspecified: Secondary | ICD-10-CM | POA: Diagnosis not present

## 2022-10-10 DIAGNOSIS — K5904 Chronic idiopathic constipation: Secondary | ICD-10-CM | POA: Diagnosis not present

## 2022-10-10 DIAGNOSIS — Z79899 Other long term (current) drug therapy: Secondary | ICD-10-CM | POA: Diagnosis not present

## 2022-10-10 DIAGNOSIS — F419 Anxiety disorder, unspecified: Secondary | ICD-10-CM | POA: Diagnosis not present

## 2022-10-10 DIAGNOSIS — D692 Other nonthrombocytopenic purpura: Secondary | ICD-10-CM | POA: Diagnosis not present

## 2022-10-10 DIAGNOSIS — I10 Essential (primary) hypertension: Secondary | ICD-10-CM | POA: Diagnosis not present

## 2022-10-13 ENCOUNTER — Ambulatory Visit: Payer: Medicare PPO | Admitting: Cardiology

## 2022-12-08 DIAGNOSIS — L299 Pruritus, unspecified: Secondary | ICD-10-CM | POA: Diagnosis not present

## 2022-12-08 DIAGNOSIS — F419 Anxiety disorder, unspecified: Secondary | ICD-10-CM | POA: Diagnosis not present

## 2022-12-08 DIAGNOSIS — Z6830 Body mass index (BMI) 30.0-30.9, adult: Secondary | ICD-10-CM | POA: Diagnosis not present

## 2022-12-08 DIAGNOSIS — J449 Chronic obstructive pulmonary disease, unspecified: Secondary | ICD-10-CM | POA: Diagnosis not present

## 2022-12-08 DIAGNOSIS — I4891 Unspecified atrial fibrillation: Secondary | ICD-10-CM | POA: Diagnosis not present

## 2022-12-26 DIAGNOSIS — E118 Type 2 diabetes mellitus with unspecified complications: Secondary | ICD-10-CM | POA: Diagnosis not present

## 2022-12-26 DIAGNOSIS — R5383 Other fatigue: Secondary | ICD-10-CM | POA: Diagnosis not present

## 2022-12-26 DIAGNOSIS — Z6829 Body mass index (BMI) 29.0-29.9, adult: Secondary | ICD-10-CM | POA: Diagnosis not present

## 2022-12-26 DIAGNOSIS — D649 Anemia, unspecified: Secondary | ICD-10-CM | POA: Diagnosis not present

## 2022-12-26 DIAGNOSIS — Z79899 Other long term (current) drug therapy: Secondary | ICD-10-CM | POA: Diagnosis not present

## 2022-12-26 DIAGNOSIS — L299 Pruritus, unspecified: Secondary | ICD-10-CM | POA: Diagnosis not present

## 2023-01-08 ENCOUNTER — Telehealth: Payer: Self-pay | Admitting: Cardiology

## 2023-01-08 NOTE — Telephone Encounter (Signed)
Patient states he has had blood in stool and his iron was low. His PCP placed him on iron supplement. He says he was told to ask can he stop eliquis because they think he has internal bleeding but can not send him for further testing until he is no longer on eliquis,

## 2023-01-08 NOTE — Telephone Encounter (Signed)
Pt c/o medication issue:  1. Name of Medication: apixaban (ELIQUIS) 5 MG TABS tablet    2. How are you currently taking this medication (dosage and times per day)?   3. Are you having a reaction (difficulty breathing--STAT)?   4. What is your medication issue? Patient is requesting call back to discuss getting off of this medication.

## 2023-01-09 NOTE — Telephone Encounter (Signed)
Wife called and stated PCP informed him to stop Eliquis today. So they will stop eliquis today until they can figure out internal bleeding.

## 2023-01-09 NOTE — Telephone Encounter (Signed)
Called to say patient is stopping his eliquis today, as he was advised to to earlier this month due to him being very anemic. They would like for wife to be called for patient to be schedule to follow up with Dr. Herbie Baltimore.

## 2023-01-11 NOTE — Telephone Encounter (Signed)
That is fine.  He only has had that 1 episode of A-fib.  Once the blood in the stool issue is resolved, would like to try to see if he can be back on it, if this returns, then we need to simply have a discussion about whether or not he stays on it.  Reasonable to stay off it for a week or so to ensure that bleeding is stopped.  Bryan Lemma, MD

## 2023-01-12 NOTE — Telephone Encounter (Signed)
Call to patient with instructions from provider Patient states he has GI set up for September 18 th for consultation. He states the "internal bleeding" was noted due to his anemia and they did stool test.  He has not seen active bleeding and takes iron and his stools are always dark so unsure if "tarry".  Called  Andrey Campanile at PCP office and asked to send notes to [place] to review to determine how long the Eliquis may need to be held Will fax notes to [place] today

## 2023-01-25 DIAGNOSIS — E118 Type 2 diabetes mellitus with unspecified complications: Secondary | ICD-10-CM | POA: Diagnosis not present

## 2023-01-25 DIAGNOSIS — F419 Anxiety disorder, unspecified: Secondary | ICD-10-CM | POA: Diagnosis not present

## 2023-01-25 DIAGNOSIS — E1169 Type 2 diabetes mellitus with other specified complication: Secondary | ICD-10-CM | POA: Diagnosis not present

## 2023-01-25 DIAGNOSIS — D649 Anemia, unspecified: Secondary | ICD-10-CM | POA: Diagnosis not present

## 2023-01-25 DIAGNOSIS — Z79899 Other long term (current) drug therapy: Secondary | ICD-10-CM | POA: Diagnosis not present

## 2023-02-09 DIAGNOSIS — R195 Other fecal abnormalities: Secondary | ICD-10-CM | POA: Diagnosis not present

## 2023-02-09 DIAGNOSIS — D509 Iron deficiency anemia, unspecified: Secondary | ICD-10-CM | POA: Diagnosis not present

## 2023-02-22 DIAGNOSIS — L309 Dermatitis, unspecified: Secondary | ICD-10-CM | POA: Diagnosis not present

## 2023-02-28 ENCOUNTER — Encounter: Payer: Self-pay | Admitting: Cardiology

## 2023-02-28 ENCOUNTER — Ambulatory Visit: Payer: Medicare PPO | Attending: Cardiology | Admitting: Cardiology

## 2023-02-28 VITALS — BP 136/74 | HR 75 | Ht 68.0 in | Wt 193.2 lb

## 2023-02-28 DIAGNOSIS — D6869 Other thrombophilia: Secondary | ICD-10-CM

## 2023-02-28 DIAGNOSIS — E1169 Type 2 diabetes mellitus with other specified complication: Secondary | ICD-10-CM

## 2023-02-28 DIAGNOSIS — Z9861 Coronary angioplasty status: Secondary | ICD-10-CM

## 2023-02-28 DIAGNOSIS — K922 Gastrointestinal hemorrhage, unspecified: Secondary | ICD-10-CM

## 2023-02-28 DIAGNOSIS — Z01810 Encounter for preprocedural cardiovascular examination: Secondary | ICD-10-CM | POA: Diagnosis not present

## 2023-02-28 DIAGNOSIS — E785 Hyperlipidemia, unspecified: Secondary | ICD-10-CM

## 2023-02-28 DIAGNOSIS — I350 Nonrheumatic aortic (valve) stenosis: Secondary | ICD-10-CM | POA: Diagnosis not present

## 2023-02-28 DIAGNOSIS — I2510 Atherosclerotic heart disease of native coronary artery without angina pectoris: Secondary | ICD-10-CM | POA: Diagnosis not present

## 2023-02-28 DIAGNOSIS — I2102 ST elevation (STEMI) myocardial infarction involving left anterior descending coronary artery: Secondary | ICD-10-CM | POA: Diagnosis not present

## 2023-02-28 DIAGNOSIS — I10 Essential (primary) hypertension: Secondary | ICD-10-CM | POA: Diagnosis not present

## 2023-02-28 DIAGNOSIS — I480 Paroxysmal atrial fibrillation: Secondary | ICD-10-CM | POA: Diagnosis not present

## 2023-02-28 NOTE — Assessment & Plan Note (Signed)
One documented episode of Afib that actually occurred after taking a cold medicine.. Currently off Eliquis and diltiazem. No recent episodes of rapid heart rate or palpitations.  Not currently on diltiazem, and echo also on hold.  Plan for diltiazem as read to restart if he has breakthrough A-fib.  Avoid triggers such as phenylephrine, ephedrine etc.-decongestants and meds.  -Consider restarting diltiazem if breakthrough spells of fast heart rates or palpitations occur. -Consider aspirin therapy if Eliquis is permanently discontinued post-GI procedure.

## 2023-02-28 NOTE — Telephone Encounter (Signed)
Patient was seen on Oct 2 ,2024

## 2023-02-28 NOTE — Assessment & Plan Note (Addendum)
Distant history of anterior MI with LAD PCI.  Unfortunately we do not have data on this yet from [place]. Remains active at least 7-8 METS without any exertional or angina or dyspnea.  Just some fatigue that could easily be explained by his anemia.Stable, On statin and ARB therapy.  -Continue current medications. -Consider resuming baby aspirin post-GI procedure if Eliquis is not restarted. Not currently on aspirin or Eliquis because of bleeding concerns, once he is cleared from GI, would probably restart Eliquis and not go on aspirin.  However if there is more bleeding concerns with Eliquis being reinitiated, with that probably try going back to aspirin 81 mg.

## 2023-02-28 NOTE — Assessment & Plan Note (Signed)
Moderate aortic stenosis noted on previous echocardiogram. No symptoms of heart failure or syncope. -Schedule follow-up echocardiogram post-GI procedure for surveillance and to establish new baseline.

## 2023-02-28 NOTE — Assessment & Plan Note (Signed)
Stable BP on low-dose HCTZ and Avapro.  Not currently taking diltiazem.

## 2023-02-28 NOTE — Progress Notes (Signed)
   02/28/23 0824  HAS-BLED Score  Hypertension (Uncontrolled, SBP > 160 mmHg) 1  Abnormal Renal Function  0  Abnormal Liver Function  0  Stroke History 0  Prior major bleeding or predisposition to bleeding 0  Labile INR 0  Age > 65 1  Medication usage predisposing to bleeding 1  Alcohol use 0  HAS-BLED Score 3  Yearly Major Bleeding Risk % 3.74

## 2023-02-28 NOTE — Assessment & Plan Note (Signed)
More than 10 years out from LAD PCI with no active anginal symptoms.  Patrick Klein perioperative risk of a major cardiac event is 0.9% according to the Revised Cardiac Risk Index (RCRI).  Therefore, he is at low risk for perioperative complications.   His functional capacity is good at 7.01 METs according to the Duke Activity Status Index (DASI).  Recommendations: According to ACC/AHA guidelines, no further cardiovascular testing needed.  The patient may proceed to surgery at acceptable risk.   Antiplatelet and/or Anticoagulation Recommendations: Aspirin can be held for 5 days prior to his surgery.  Please resume Aspirin post operatively when it is felt to be safe from a bleeding standpoint.  Eliquis (Apixaban) can be held for Indefinitely leading up to surgery/procedure.  Can determine when to restart pending results of procedure

## 2023-02-28 NOTE — Patient Instructions (Signed)
Medication Instructions:    From a cardiac standpoint  cleared for  colonoscopy - may hold Eliquis  until  procedure and restart once approved by GI. If you do not restart Eliquis you will need to start enteric coated Aspirin  81 mg     If you have an episode of atrial fibrillation or fast heart rate restart taking Diltiazem daily   Okay to restart Krill oil after colonoscopy if okay with GI *If you need a refill on your cardiac medications before your next appointment, please call your pharmacy*   Lab Work:  Not needed   Testing/Procedures:  Will be schedule at [facility] [address] Your physician has requested that you have an echocardiogram. Echocardiography is a painless test that uses sound waves to create images of your heart. It provides your doctor with information about the size and shape of your heart and how well your heart's chambers and valves are working. This procedure takes approximately one hour. There are no restrictions for this procedure. Please do NOT wear cologne, perfume, aftershave, or lotions (deodorant is allowed). Please arrive 15 minutes prior to your appointment time.    Follow-Up: At [facility], you and your health needs are our priority.  As part of our continuing mission to provide you with exceptional heart care, we have created designated Provider Care Teams.  These Care Teams include your primary Cardiologist (physician) and Advanced Practice Providers (APPs -  Physician Assistants and Nurse Practitioners) who all work together to provide you with the care you need, when you need it.     Your next appointment:   6 month(s)  The format for your next appointment:   In Person  Provider:   Bryan Lemma, MD    Other Instructions    From a cardiac standpoint  cleared for  colonoscopy - may hold Eliquis  until  procedure and restart once approved by GI. If you do not restart Eliquis you will need to start enteric coated Aspirin   81 mg

## 2023-02-28 NOTE — Assessment & Plan Note (Addendum)
Well-controlled on current statin therapy -> pravastatin 80 mg.  Was taking krill oil but currently held for upcoming GI procedure. -Resume krill oil post-GI procedure when cleared by GI team. Labs just checked in August.  Will need to be rechecked in roughly 6 months.  For diabetes he remains on Trulicity  could consider potentially switching to Ozempic or Mounjaro which have more cardiac indications

## 2023-02-28 NOTE — Assessment & Plan Note (Signed)
Low blood counts and suspected blood loss in stool. Currently off Eliquis and aspirin. No visible blood in stool. Colonoscopy and endoscopy scheduled for 03/21/2023. -Hold Eliquis and Trulicity until after GI procedure. => Restart when considered safe by GI.

## 2023-02-28 NOTE — Progress Notes (Signed)
Cardiology Office Note:  .   Date:  02/28/2023  ID:  Patrick Klein, DOB [birth year], MRN [id number] PCP: Otila Back  [facility] Providers Cardiologist:  Bryan Lemma, MD     No chief complaint on file.   Patient Profile: Patrick Klein     Patrick Klein is an obese 77 y.o. male Former Smoker with a PMH notable for prior MI/CAD-PCI, HLD, PAF, Moderate AS, and COPD who presents here for DISCUSSION ABOUT ANTICOAGULATION at the request of O'Buch, Greta, PA-C.  History of anterior-STEMI involving LAD--PCI of the LAD (2012 while living in, [place]).  Preserved EF on Echo Quit smoking at the time of his MI 1 episode of A-fib that involved taking a decongestant -> Eliquis temporarily on hold Moderate AS by Echo (mean AVG 26 mmHg) HLD-with DM: On pravastatin, and Trulicity.  Last A1c 5.9 HTN. COPD  Devvon Klein was last seen on June 05, 2022 as a New Patient Evaluation (transferred from [facility]  No active angina or heart failure other than R Knee pain, able to achieve 6-8 METS.  However he did note some exercise intolerance..  No recurrent A-fib.  Evaluated for preop for hip surgery.  Recommended holding Eliquis for hip surgery-intermediate risk with existing risk factors..  Had not been taking diltiazem routinely, but recommended he take it leading up to his surgery.  Suggested went that when he restarted Eliquis, he should stop aspirin. ROS included baseline dyspnea with occasional cough from underlying COPD.  Mild exercise intolerance and fatigue.  Anxiety about health conditions.  Right knee and hip pain.=> OK for Knee Sgx.      Subjective   INTERVAL HPI Telephone call 01/12/2023 indicating that his PCP wanted to stop Eliquis due to GI bleeding..  Noted blood in stool with low iron.  Iron supplement asked about stopping this until GI evaluation.  Had 1 episode of A-fib and it was reasonable to hold Eliquis until bleeding stopped/resolved. Seen and for GI consultation on  02/09/2023: Referred for microcytic anemia with a hemoglobin 8.1.  Indicated this is relatively new.  Noted occasional rectal bleeding secondary to hemorrhoids.  Also noted some GERD that was improved with omeprazole.  Mild improvement after stopping Eliquis.  Unfortunately, constipation worse with time. => Scheduled for EGD and colonoscopy.  Told to skip Trulicity prior to procedure.  Discussed the use of AI scribe software for clinical note transcription with the patient, who gave verbal consent to proceed.  History of Present Illness   Patrick Klein presents for a follow-up visit.  He has stopped taking Eliquis per GI recommendations & has not been on ASA.Patrick Klein    He denies noticing any blood in his stool and reports no rapid heart rates, chest pain, pressure, or tightness. He also denies experiencing shortness of breath, dizziness, or fainting spells.    The patient is able to perform daily activities around the house and exercises on a stationary bike. He reports no sudden onset of weakness on one side versus the other.    He has been experiencing constipation, which has been exacerbated by iron supplements. The patient also has a history of irritable bowel syndrome. He has a persistent cough and wheeze at night, which is more pronounced when lying down. The patient underwent a successful knee replacement surgery recently. He denies any recent illnesses, fevers, chills, diarrhea, swelling in the legs, pain in the calves or thighs when walking, or sores on his feet.      Cardiovascular ROS: no chest pain  or dyspnea on exertion positive for - some shortness of breath with lying down related cough, but does not sound like orthopnea. negative for - edema, irregular heartbeat, orthopnea, palpitations, paroxysmal nocturnal dyspnea, rapid heart rate, shortness of breath, or syncope or near syncope, TIA or amaurosis fugax, claudication.  ROS:   Review of Systems - Negative except symptoms noted in last  paragraph above     Objective   Studies Reviewed: Patrick Klein   EKG Interpretation Date/Time:  Wednesday February 28 2023 08:28:36 EDT Ventricular Rate:  75 PR Interval:  162 QRS Duration:  110 QT Interval:  402 QTC Calculation: 448 R Axis:   -50  Text Interpretation: Sinus rhythm with marked sinus arrhythmia Left anterior fascicular block Moderate voltage criteria for LVH, may be normal variant ( R in aVL , Cornell product ) Septal infarct , age undetermined When compared with ECG of 23-Jul-2019 09:45, Premature ventricular complexes NO LONGER PRESENT Confirmed by Bryan Lemma ([id number]) on 02/28/2023 8:43:03 AM    Echo ordered, but not yet done  LABS A1c: 5.5 (01/29/2023) Hb: 10.3 (01/25/2023) Total cholesterol: 98 (01/25/2023) Triglycerides: 60 (01/25/2023) HDL: 43 (01/25/2023) LDL: 41 (01/25/2023)   Risk Assessment/Calculations:    CHA2DS2-VASc Score = 5   This indicates a 7.2% annual risk of stroke. The patient's score is based upon: CHF History: 0 HTN History: 1 Diabetes History: 1 Stroke History: 0 Vascular Disease History: 1 Age Score: 2 Gender Score: 0  No data recorded    02/28/23 0824  HAS-BLED Score  Hypertension (Uncontrolled, SBP > 160 mmHg) 1  Abnormal Renal Function  0  Abnormal Liver Function  0  Stroke History 0  Prior major bleeding or predisposition to bleeding 0  Labile INR 0  Age > 65 1  Medication usage predisposing to bleeding 1  Alcohol use 0  HAS-BLED Score 3  Yearly Major Bleeding Risk % 3.74      Mr. Mallen's perioperative risk of a major cardiac event is 0.9% according to the Revised Cardiac Risk Index (RCRI).  Therefore, he is at low risk for perioperative complications.   His functional capacity is good at 7.01 METs according to the Duke Activity Status Index (DASI). Recommendations: According to ACC/AHA guidelines, no further cardiovascular testing needed.  The patient may proceed to surgery at acceptable risk.   Antiplatelet and/or  Anticoagulation Recommendations: Aspirin can be held for 5 days prior to his surgery.  Please resume Aspirin post operatively when it is felt to be safe from a bleeding standpoint.  Eliquis (Apixaban) can be held for Indefinitely leading up to surgery/procedure.  Can determine when to restart pending results of procedure            Physical Exam:   VS:  BP 136/74 (BP Location: Left Arm, Patient Position: Sitting, Cuff Size: Normal)   Pulse 75   Ht 5\' 8"  (1.727 m)   Wt 193 lb 3.2 oz (87.6 kg)   SpO2 95%   BMI 29.38 kg/m    Wt Readings from Last 3 Encounters:  02/28/23 193 lb 3.2 oz (87.6 kg)  07/11/22 189 lb 9.5 oz (86 kg)  07/05/22 190 lb (86.2 kg)    GEN: Well nourished, well developed in no acute distress; obese NECK: No JVD; No carotid bruits CARDIAC: Normal S1, S2; RRR, high-pitched 6/6 SEM at RUSB-neck, otherwise no additional murmurs, rubs, gallops RESPIRATORY: Distant breath sounds with mild adventitial sounds.  Otherwise no rales, wheezing or rhonchi ; nonlabored, good air movement. ABDOMEN: Soft, non-tender,  non-distended EXTREMITIES:  No edema; No deformity; walks with a cane     ASSESSMENT AND PLAN: .    Problem List Items Addressed This Visit       Cardiology Problems   CAD S/P percutaneous coronary angioplasty - Primary (Chronic)    Distant history of anterior MI with LAD PCI.  Unfortunately we do not have data on this yet from [place]. Remains active at least 7-8 METS without any exertional or angina or dyspnea.  Just some fatigue that could easily be explained by his anemia.Stable, On statin and ARB therapy.  -Continue current medications. -Consider resuming baby aspirin post-GI procedure if Eliquis is not restarted. Not currently on aspirin or Eliquis because of bleeding concerns, once he is cleared from GI, would probably restart Eliquis and not go on aspirin.  However if there is more bleeding concerns with Eliquis being reinitiated, with that probably try  going back to aspirin 81 mg.      Relevant Orders   EKG 12-Lead (Completed)   ECHOCARDIOGRAM COMPLETE   Essential hypertension (Chronic)    Stable BP on low-dose HCTZ and Avapro.  Not currently taking diltiazem.      Relevant Orders   EKG 12-Lead (Completed)   h/o ST elevation (STEMI) myocardial infarction involving left anterior descending coronary artery (HCC) (Chronic)   Relevant Orders   EKG 12-Lead (Completed)   Hyperlipidemia associated with type 2 diabetes mellitus (HCC) (Chronic)    Well-controlled on current statin therapy -> pravastatin 80 mg.  Was taking krill oil but currently held for upcoming GI procedure. -Resume krill oil post-GI procedure when cleared by GI team. Labs just checked in August.  Will need to be rechecked in roughly 6 months.  For diabetes he remains on Trulicity  could consider potentially switching to Ozempic or Mounjaro which have more cardiac indications       Relevant Orders   EKG 12-Lead (Completed)   Moderate aortic stenosis by prior echocardiogram (Chronic)    Moderate aortic stenosis noted on previous echocardiogram. No symptoms of heart failure or syncope. -Schedule follow-up echocardiogram post-GI procedure for surveillance and to establish new baseline.      Relevant Orders   EKG 12-Lead (Completed)   ECHOCARDIOGRAM COMPLETE   PAF (paroxysmal atrial fibrillation) (HCC) (Chronic)    One documented episode of Afib that actually occurred after taking a cold medicine.. Currently off Eliquis and diltiazem. No recent episodes of rapid heart rate or palpitations.  Not currently on diltiazem, and echo also on hold.  Plan for diltiazem as read to restart if he has breakthrough A-fib.  Avoid triggers such as phenylephrine, ephedrine etc.-decongestants and meds.  -Consider restarting diltiazem if breakthrough spells of fast heart rates or palpitations occur. -Consider aspirin therapy if Eliquis is permanently discontinued post-GI procedure.       Relevant Orders   EKG 12-Lead (Completed)   ECHOCARDIOGRAM COMPLETE     Other   GI bleed    Low blood counts and suspected blood loss in stool. Currently off Eliquis and aspirin. No visible blood in stool. Colonoscopy and endoscopy scheduled for 03/21/2023. -Hold Eliquis and Trulicity until after GI procedure. => Restart when considered safe by GI.      Hypercoagulability due to atrial fibrillation (HCC) (Chronic)    1 episode of A-fib, has been Eliquis ever since, but currently on hold because of GI bleeding issues.  There is enough indication for her to restart it, but would defer to GI medicine. -If no source of  bleeding found, rechallenge Eliquis approximately one month post-procedure and monitor blood counts.      Relevant Orders   EKG 12-Lead (Completed)   Preop cardiovascular exam    More than 10 years out from LAD PCI with no active anginal symptoms.  Mr. Douds perioperative risk of a major cardiac event is 0.9% according to the Revised Cardiac Risk Index (RCRI).  Therefore, he is at low risk for perioperative complications.   His functional capacity is good at 7.01 METs according to the Duke Activity Status Index (DASI).  Recommendations: According to ACC/AHA guidelines, no further cardiovascular testing needed.  The patient may proceed to surgery at acceptable risk.   Antiplatelet and/or Anticoagulation Recommendations: Aspirin can be held for 5 days prior to his surgery.  Please resume Aspirin post operatively when it is felt to be safe from a bleeding standpoint.  Eliquis (Apixaban) can be held for Indefinitely leading up to surgery/procedure.  Can determine when to restart pending results of procedure               Dispo: Return in about 6 months (around 08/29/2023) for 6 month follow-up with me. Follow-up in April 2025 unless issues arise.   Total time spent: 25 min spent with patient + 36 min spent charting = 61 min    ISI understanding  Signed, Marykay Lex, MD, MS Bryan Lemma, M.D., M.S. Interventional Cardiologist  [facility]  Pager # [phone] Phone # [phone] [address] [place], [place] [address]

## 2023-02-28 NOTE — Assessment & Plan Note (Signed)
1 episode of A-fib, has been Eliquis ever since, but currently on hold because of GI bleeding issues.  There is enough indication for her to restart it, but would defer to GI medicine. -If no source of bleeding found, rechallenge Eliquis approximately one month post-procedure and monitor blood counts.

## 2023-03-12 ENCOUNTER — Other Ambulatory Visit: Payer: Self-pay | Admitting: Cardiology

## 2023-03-12 DIAGNOSIS — K922 Gastrointestinal hemorrhage, unspecified: Secondary | ICD-10-CM

## 2023-03-12 DIAGNOSIS — I480 Paroxysmal atrial fibrillation: Secondary | ICD-10-CM

## 2023-03-12 DIAGNOSIS — Z01810 Encounter for preprocedural cardiovascular examination: Secondary | ICD-10-CM

## 2023-03-12 DIAGNOSIS — I350 Nonrheumatic aortic (valve) stenosis: Secondary | ICD-10-CM

## 2023-03-12 DIAGNOSIS — I10 Essential (primary) hypertension: Secondary | ICD-10-CM

## 2023-03-12 DIAGNOSIS — E1169 Type 2 diabetes mellitus with other specified complication: Secondary | ICD-10-CM

## 2023-03-12 DIAGNOSIS — I2102 ST elevation (STEMI) myocardial infarction involving left anterior descending coronary artery: Secondary | ICD-10-CM

## 2023-03-12 DIAGNOSIS — I2510 Atherosclerotic heart disease of native coronary artery without angina pectoris: Secondary | ICD-10-CM

## 2023-03-12 NOTE — Telephone Encounter (Signed)
Eliquis 5mg  refill request received. Patient is 77 years old, weight-87.6kg, Crea-0.68 on 07/12/22, Diagnosis-Afib, and last seen by Dr. Herbie Baltimore on 02/28/23. Dose is appropriate based on dosing criteria. Will send in refill to requested pharmacy.

## 2023-03-21 DIAGNOSIS — K5140 Inflammatory polyps of colon without complications: Secondary | ICD-10-CM | POA: Diagnosis not present

## 2023-03-21 DIAGNOSIS — K5730 Diverticulosis of large intestine without perforation or abscess without bleeding: Secondary | ICD-10-CM | POA: Diagnosis not present

## 2023-03-21 DIAGNOSIS — R195 Other fecal abnormalities: Secondary | ICD-10-CM | POA: Diagnosis not present

## 2023-03-21 DIAGNOSIS — K641 Second degree hemorrhoids: Secondary | ICD-10-CM | POA: Diagnosis not present

## 2023-03-21 DIAGNOSIS — K2100 Gastro-esophageal reflux disease with esophagitis, without bleeding: Secondary | ICD-10-CM | POA: Diagnosis not present

## 2023-03-21 DIAGNOSIS — D123 Benign neoplasm of transverse colon: Secondary | ICD-10-CM | POA: Diagnosis not present

## 2023-03-21 DIAGNOSIS — K208 Other esophagitis without bleeding: Secondary | ICD-10-CM | POA: Diagnosis not present

## 2023-03-21 DIAGNOSIS — K529 Noninfective gastroenteritis and colitis, unspecified: Secondary | ICD-10-CM | POA: Diagnosis not present

## 2023-03-21 DIAGNOSIS — D132 Benign neoplasm of duodenum: Secondary | ICD-10-CM | POA: Diagnosis not present

## 2023-03-21 DIAGNOSIS — K2950 Unspecified chronic gastritis without bleeding: Secondary | ICD-10-CM | POA: Diagnosis not present

## 2023-03-21 DIAGNOSIS — K317 Polyp of stomach and duodenum: Secondary | ICD-10-CM | POA: Diagnosis not present

## 2023-03-21 DIAGNOSIS — D509 Iron deficiency anemia, unspecified: Secondary | ICD-10-CM | POA: Diagnosis not present

## 2023-03-21 DIAGNOSIS — K3189 Other diseases of stomach and duodenum: Secondary | ICD-10-CM | POA: Diagnosis not present

## 2023-03-21 DIAGNOSIS — K635 Polyp of colon: Secondary | ICD-10-CM | POA: Diagnosis not present

## 2023-03-22 DIAGNOSIS — D485 Neoplasm of uncertain behavior of skin: Secondary | ICD-10-CM | POA: Diagnosis not present

## 2023-03-22 DIAGNOSIS — C4492 Squamous cell carcinoma of skin, unspecified: Secondary | ICD-10-CM | POA: Diagnosis not present

## 2023-03-22 DIAGNOSIS — L821 Other seborrheic keratosis: Secondary | ICD-10-CM | POA: Diagnosis not present

## 2023-03-22 DIAGNOSIS — L570 Actinic keratosis: Secondary | ICD-10-CM | POA: Diagnosis not present

## 2023-03-22 DIAGNOSIS — L814 Other melanin hyperpigmentation: Secondary | ICD-10-CM | POA: Diagnosis not present

## 2023-03-22 DIAGNOSIS — L820 Inflamed seborrheic keratosis: Secondary | ICD-10-CM | POA: Diagnosis not present

## 2023-03-22 DIAGNOSIS — D235 Other benign neoplasm of skin of trunk: Secondary | ICD-10-CM | POA: Diagnosis not present

## 2023-03-22 DIAGNOSIS — L720 Epidermal cyst: Secondary | ICD-10-CM | POA: Diagnosis not present

## 2023-03-22 DIAGNOSIS — D1721 Benign lipomatous neoplasm of skin and subcutaneous tissue of right arm: Secondary | ICD-10-CM | POA: Diagnosis not present

## 2023-04-02 ENCOUNTER — Ambulatory Visit (HOSPITAL_BASED_OUTPATIENT_CLINIC_OR_DEPARTMENT_OTHER)
Admission: RE | Admit: 2023-04-02 | Discharge: 2023-04-02 | Disposition: A | Discharge status: Home / Self Care | Payer: Medicare PPO | Source: Ambulatory Visit | Attending: Cardiology | Admitting: Cardiology

## 2023-04-02 DIAGNOSIS — I480 Paroxysmal atrial fibrillation: Secondary | ICD-10-CM | POA: Insufficient documentation

## 2023-04-02 DIAGNOSIS — I350 Nonrheumatic aortic (valve) stenosis: Secondary | ICD-10-CM | POA: Diagnosis not present

## 2023-04-04 LAB — ECHOCARDIOGRAM COMPLETE
AR max vel: 1.22 cm2
AV Area VTI: 1.24 cm2
AV Area mean vel: 1.22 cm2
AV Mean grad: 18 mm[Hg]
AV Peak grad: 30.6 mm[Hg]
Ao pk vel: 2.77 m/s
Area-P 1/2: 2.55 cm2
Calc EF: 58 %
MV M vel: 2.01 m/s
MV Peak grad: 16.1 mm[Hg]
P 1/2 time: 535 ms
S' Lateral: 3.5 cm
Single Plane A2C EF: 58.6 %
Single Plane A4C EF: 58.1 %

## 2023-04-05 DIAGNOSIS — D649 Anemia, unspecified: Secondary | ICD-10-CM | POA: Diagnosis not present

## 2023-04-05 DIAGNOSIS — E118 Type 2 diabetes mellitus with unspecified complications: Secondary | ICD-10-CM | POA: Diagnosis not present

## 2023-04-05 DIAGNOSIS — Z139 Encounter for screening, unspecified: Secondary | ICD-10-CM | POA: Diagnosis not present

## 2023-04-05 DIAGNOSIS — Z125 Encounter for screening for malignant neoplasm of prostate: Secondary | ICD-10-CM | POA: Diagnosis not present

## 2023-04-05 DIAGNOSIS — F419 Anxiety disorder, unspecified: Secondary | ICD-10-CM | POA: Diagnosis not present

## 2023-04-05 DIAGNOSIS — I10 Essential (primary) hypertension: Secondary | ICD-10-CM | POA: Diagnosis not present

## 2023-04-05 DIAGNOSIS — I4891 Unspecified atrial fibrillation: Secondary | ICD-10-CM | POA: Diagnosis not present

## 2023-04-06 ENCOUNTER — Telehealth: Payer: Self-pay | Admitting: *Deleted

## 2023-04-06 NOTE — Telephone Encounter (Signed)
Unable to leave a message- voice mail has not been set up yet

## 2023-04-06 NOTE — Telephone Encounter (Signed)
-----   Message from Bryan Lemma sent at 04/06/2023  3:15 PM EST ----- Echocardiogram results: Lots of premature ventricular beats noted Pulm function is may be slightly lower than it was last year at 50 to 55% (had been 55 to 60%).  There does appear to be wall motion abnormality in the area that was affected by the interventional heart attack.  There is also some impaired relaxation but otherwise normal.  The aortic valve appears to have moderate narrowing with a mean gradient of 21-22 mmHg.  Not enough to consider treatment. There is trivial pericardial effusion-not enough to worry about.  Otherwise things look pretty normal.  Overall very reassuring.

## 2023-04-06 NOTE — Telephone Encounter (Signed)
Patient identification verified by 2 forms. Marilynn Rail, RN   Called and spoke to patient  RN reviewed result note from Dr. Herbie Baltimore  RN reviewed per 10/2 visit note:   From a cardiac standpoint  cleared for  colonoscopy - may hold Eliquis  until  procedure and restart once approved by GI. If you do not restart Eliquis you will need to start enteric coated Aspirin  81 mg       If you have an episode of atrial fibrillation or fast heart rate restart taking Diltiazem daily  Patient verbalized understanding, no questions or concerns at this time

## 2023-04-06 NOTE — Telephone Encounter (Signed)
Patient is returning call. Requesting call back 

## 2023-04-10 DIAGNOSIS — R140 Abdominal distension (gaseous): Secondary | ICD-10-CM | POA: Diagnosis not present

## 2023-04-10 DIAGNOSIS — R109 Unspecified abdominal pain: Secondary | ICD-10-CM | POA: Diagnosis not present

## 2023-04-10 DIAGNOSIS — K5900 Constipation, unspecified: Secondary | ICD-10-CM | POA: Diagnosis not present

## 2023-04-10 DIAGNOSIS — R10817 Generalized abdominal tenderness: Secondary | ICD-10-CM | POA: Diagnosis not present

## 2023-05-14 DIAGNOSIS — K581 Irritable bowel syndrome with constipation: Secondary | ICD-10-CM | POA: Diagnosis not present

## 2023-05-14 DIAGNOSIS — K209 Esophagitis, unspecified without bleeding: Secondary | ICD-10-CM | POA: Diagnosis not present

## 2023-05-14 DIAGNOSIS — D500 Iron deficiency anemia secondary to blood loss (chronic): Secondary | ICD-10-CM | POA: Diagnosis not present

## 2023-05-14 DIAGNOSIS — Z860101 Personal history of adenomatous and serrated colon polyps: Secondary | ICD-10-CM | POA: Diagnosis not present

## 2023-06-26 DIAGNOSIS — E1169 Type 2 diabetes mellitus with other specified complication: Secondary | ICD-10-CM | POA: Diagnosis not present

## 2023-06-26 DIAGNOSIS — E663 Overweight: Secondary | ICD-10-CM | POA: Diagnosis not present

## 2023-06-26 DIAGNOSIS — I10 Essential (primary) hypertension: Secondary | ICD-10-CM | POA: Diagnosis not present

## 2023-06-26 DIAGNOSIS — E785 Hyperlipidemia, unspecified: Secondary | ICD-10-CM | POA: Diagnosis not present

## 2023-06-26 DIAGNOSIS — Z79899 Other long term (current) drug therapy: Secondary | ICD-10-CM | POA: Diagnosis not present

## 2023-06-26 DIAGNOSIS — E118 Type 2 diabetes mellitus with unspecified complications: Secondary | ICD-10-CM | POA: Diagnosis not present

## 2023-06-26 DIAGNOSIS — F419 Anxiety disorder, unspecified: Secondary | ICD-10-CM | POA: Diagnosis not present

## 2023-06-26 DIAGNOSIS — J449 Chronic obstructive pulmonary disease, unspecified: Secondary | ICD-10-CM | POA: Diagnosis not present

## 2023-06-26 DIAGNOSIS — I4891 Unspecified atrial fibrillation: Secondary | ICD-10-CM | POA: Diagnosis not present

## 2023-07-10 DIAGNOSIS — J449 Chronic obstructive pulmonary disease, unspecified: Secondary | ICD-10-CM | POA: Diagnosis not present

## 2023-07-10 DIAGNOSIS — I509 Heart failure, unspecified: Secondary | ICD-10-CM | POA: Diagnosis not present

## 2023-07-10 DIAGNOSIS — Z6829 Body mass index (BMI) 29.0-29.9, adult: Secondary | ICD-10-CM | POA: Diagnosis not present

## 2023-07-10 DIAGNOSIS — R059 Cough, unspecified: Secondary | ICD-10-CM | POA: Diagnosis not present

## 2023-07-10 DIAGNOSIS — E118 Type 2 diabetes mellitus with unspecified complications: Secondary | ICD-10-CM | POA: Diagnosis not present

## 2023-08-22 DIAGNOSIS — Z9181 History of falling: Secondary | ICD-10-CM | POA: Diagnosis not present

## 2023-08-22 DIAGNOSIS — Z0000 Encounter for general adult medical examination without abnormal findings: Secondary | ICD-10-CM | POA: Diagnosis not present

## 2023-08-22 DIAGNOSIS — E118 Type 2 diabetes mellitus with unspecified complications: Secondary | ICD-10-CM | POA: Diagnosis not present

## 2023-08-22 DIAGNOSIS — F419 Anxiety disorder, unspecified: Secondary | ICD-10-CM | POA: Diagnosis not present

## 2023-08-22 DIAGNOSIS — I4891 Unspecified atrial fibrillation: Secondary | ICD-10-CM | POA: Diagnosis not present

## 2023-08-22 DIAGNOSIS — Z6829 Body mass index (BMI) 29.0-29.9, adult: Secondary | ICD-10-CM | POA: Diagnosis not present

## 2023-10-11 DIAGNOSIS — B029 Zoster without complications: Secondary | ICD-10-CM | POA: Diagnosis not present

## 2023-10-19 DIAGNOSIS — E785 Hyperlipidemia, unspecified: Secondary | ICD-10-CM | POA: Diagnosis not present

## 2023-10-19 DIAGNOSIS — F419 Anxiety disorder, unspecified: Secondary | ICD-10-CM | POA: Diagnosis not present

## 2023-10-19 DIAGNOSIS — Z79899 Other long term (current) drug therapy: Secondary | ICD-10-CM | POA: Diagnosis not present

## 2023-10-19 DIAGNOSIS — I10 Essential (primary) hypertension: Secondary | ICD-10-CM | POA: Diagnosis not present

## 2023-10-19 DIAGNOSIS — D649 Anemia, unspecified: Secondary | ICD-10-CM | POA: Diagnosis not present

## 2023-10-19 DIAGNOSIS — E1169 Type 2 diabetes mellitus with other specified complication: Secondary | ICD-10-CM | POA: Diagnosis not present

## 2023-10-19 DIAGNOSIS — Z6828 Body mass index (BMI) 28.0-28.9, adult: Secondary | ICD-10-CM | POA: Diagnosis not present

## 2023-10-31 DIAGNOSIS — Z96651 Presence of right artificial knee joint: Secondary | ICD-10-CM | POA: Diagnosis not present

## 2023-10-31 DIAGNOSIS — Z471 Aftercare following joint replacement surgery: Secondary | ICD-10-CM | POA: Diagnosis not present

## 2024-01-21 DIAGNOSIS — D649 Anemia, unspecified: Secondary | ICD-10-CM | POA: Diagnosis not present

## 2024-01-21 DIAGNOSIS — F419 Anxiety disorder, unspecified: Secondary | ICD-10-CM | POA: Diagnosis not present

## 2024-01-21 DIAGNOSIS — E785 Hyperlipidemia, unspecified: Secondary | ICD-10-CM | POA: Diagnosis not present

## 2024-01-21 DIAGNOSIS — I10 Essential (primary) hypertension: Secondary | ICD-10-CM | POA: Diagnosis not present

## 2024-01-21 DIAGNOSIS — K5909 Other constipation: Secondary | ICD-10-CM | POA: Diagnosis not present

## 2024-01-21 DIAGNOSIS — Z6829 Body mass index (BMI) 29.0-29.9, adult: Secondary | ICD-10-CM | POA: Diagnosis not present

## 2024-01-21 DIAGNOSIS — Z79899 Other long term (current) drug therapy: Secondary | ICD-10-CM | POA: Diagnosis not present

## 2024-01-21 DIAGNOSIS — J449 Chronic obstructive pulmonary disease, unspecified: Secondary | ICD-10-CM | POA: Diagnosis not present

## 2024-01-21 DIAGNOSIS — E1169 Type 2 diabetes mellitus with other specified complication: Secondary | ICD-10-CM | POA: Diagnosis not present

## 2024-04-22 DIAGNOSIS — Z79899 Other long term (current) drug therapy: Secondary | ICD-10-CM | POA: Diagnosis not present

## 2024-04-22 DIAGNOSIS — Z125 Encounter for screening for malignant neoplasm of prostate: Secondary | ICD-10-CM | POA: Diagnosis not present

## 2024-04-22 DIAGNOSIS — M069 Rheumatoid arthritis, unspecified: Secondary | ICD-10-CM | POA: Diagnosis not present

## 2024-04-22 DIAGNOSIS — E785 Hyperlipidemia, unspecified: Secondary | ICD-10-CM | POA: Diagnosis not present

## 2024-04-22 DIAGNOSIS — E1169 Type 2 diabetes mellitus with other specified complication: Secondary | ICD-10-CM | POA: Diagnosis not present

## 2024-04-22 DIAGNOSIS — K5909 Other constipation: Secondary | ICD-10-CM | POA: Diagnosis not present

## 2024-04-22 DIAGNOSIS — D649 Anemia, unspecified: Secondary | ICD-10-CM | POA: Diagnosis not present

## 2024-04-22 DIAGNOSIS — F419 Anxiety disorder, unspecified: Secondary | ICD-10-CM | POA: Diagnosis not present

## 2024-04-22 DIAGNOSIS — I10 Essential (primary) hypertension: Secondary | ICD-10-CM | POA: Diagnosis not present
